# Patient Record
Sex: Female | Born: 1960 | Race: Black or African American | Hispanic: No | Smoking: Former smoker
Health system: Southern US, Community
[De-identification: ages and names within clinical notes are randomized; demographics above are authoritative.]

## PROBLEM LIST (undated history)

## (undated) DIAGNOSIS — E119 Type 2 diabetes mellitus without complications: Secondary | ICD-10-CM

## (undated) DIAGNOSIS — F32A Depression, unspecified: Secondary | ICD-10-CM

## (undated) DIAGNOSIS — T7840XA Allergy, unspecified, initial encounter: Secondary | ICD-10-CM

## (undated) DIAGNOSIS — F329 Major depressive disorder, single episode, unspecified: Secondary | ICD-10-CM

## (undated) DIAGNOSIS — K219 Gastro-esophageal reflux disease without esophagitis: Secondary | ICD-10-CM

## (undated) DIAGNOSIS — J45909 Unspecified asthma, uncomplicated: Secondary | ICD-10-CM

## (undated) HISTORY — DX: Depression, unspecified: F32.A

## (undated) HISTORY — PX: BREAST SURGERY: SHX581

## (undated) HISTORY — DX: Type 2 diabetes mellitus without complications: E11.9

## (undated) HISTORY — PX: LAPAROSCOPIC GASTRIC SLEEVE RESECTION: SHX5895

## (undated) HISTORY — DX: Allergy, unspecified, initial encounter: T78.40XA

## (undated) HISTORY — DX: Gastro-esophageal reflux disease without esophagitis: K21.9

## (undated) HISTORY — DX: Major depressive disorder, single episode, unspecified: F32.9

## (undated) HISTORY — DX: Unspecified asthma, uncomplicated: J45.909

---

## 2013-10-31 ENCOUNTER — Ambulatory Visit (INDEPENDENT_AMBULATORY_CARE_PROVIDER_SITE_OTHER): Payer: BC Managed Care – PPO | Admitting: Family Medicine

## 2013-10-31 ENCOUNTER — Ambulatory Visit (INDEPENDENT_AMBULATORY_CARE_PROVIDER_SITE_OTHER): Payer: BC Managed Care – PPO

## 2013-10-31 VITALS — BP 120/80 | HR 81 | Temp 98.3°F | Resp 20 | Ht 67.5 in | Wt 177.5 lb

## 2013-10-31 DIAGNOSIS — T07XXXA Unspecified multiple injuries, initial encounter: Secondary | ICD-10-CM

## 2013-10-31 DIAGNOSIS — S4991XA Unspecified injury of right shoulder and upper arm, initial encounter: Secondary | ICD-10-CM

## 2013-10-31 DIAGNOSIS — M79609 Pain in unspecified limb: Secondary | ICD-10-CM

## 2013-10-31 DIAGNOSIS — M79601 Pain in right arm: Secondary | ICD-10-CM

## 2013-10-31 MED ORDER — CYCLOBENZAPRINE HCL 5 MG PO TABS: ORAL_TABLET | ORAL | Status: DC

## 2013-10-31 MED ORDER — KETOROLAC TROMETHAMINE 60 MG/2ML IM SOLN
60.0000 mg | Freq: Once | INTRAMUSCULAR | Status: AC
Start: 2013-10-31 — End: 2013-10-31
  Administered 2013-10-31: 60 mg via INTRAMUSCULAR

## 2013-10-31 MED ORDER — IBUPROFEN 800 MG PO TABS: 800.0000 mg | ORAL_TABLET | Freq: Three times a day (TID) | ORAL | Status: DC | PRN

## 2013-10-31 NOTE — Progress Notes (Signed)
   Subjective:    Patient ID: Crystal Medina, female    DOB: 20-May-1960, 53 y.o.   MRN: [id number]  HPI Patient presents today with right arm pain. She was walking on a treadmill about 6 days ago when she fell backward and landed on her right arm. She has been having pain all through her arm and up into her neck. She applied ice to her arm and has been limiting movement and the pain has gotten worse today. She took motrin 800 this morning with some relief. She reports that she is very sensitive to medication and can not tolerate narcotic pain relievers.  She is not currently taking any medication. She has a history of DM2 in the past, but has lost weight and was able to come off medications.   Past Medical History  Diagnosis Date  . Allergy   . Asthma   . Diabetes mellitus without complication    Past Surgical History  Procedure Laterality Date  . Breast surgery     Family History  Problem Relation Age of Onset  . Diabetes Mother   . Hyperlipidemia Mother   . Heart disease Father   . Hyperlipidemia Father   . Hypertension Father   . Cancer Sister   . Hyperlipidemia Sister   . Hypertension Sister   . Mental illness Maternal Grandmother    History  Substance Use Topics  . Smoking status: Never Smoker   . Smokeless tobacco: Never Used  . Alcohol Use: 1.0 oz/week    2 drink(s) per week   Review of Systems No swelling, no bruising    Objective:   Physical Exam  Vitals reviewed. Constitutional: She is oriented to person, place, and time. She appears well-developed and well-nourished.  HENT:  Head: Normocephalic and atraumatic.  Eyes: Conjunctivae are normal.  Neck: Normal range of motion. Neck supple.  Cardiovascular: Normal rate.   Pulmonary/Chest: Effort normal.  Musculoskeletal: Normal range of motion. She exhibits tenderness. She exhibits no edema.  Full ROM neck, shoulders, elbows, wrists. No erythema, no ecchymosis, Tender over posterior, mid 1/3 of humerus.     Neurological: She is alert and oriented to person, place, and time.  Skin: Skin is warm and dry.  Psychiatric: She has a normal mood and affect. Her behavior is normal. Judgment and thought content normal.   Right humerus XRAY- [facility] reading (PRIMARY) by  Dr. Alwyn RenHopper- no fracture.   Assessment & Plan:  1. Arm injury, right, initial encounter - DG Humerus Right; Future - ibuprofen (ADVIL,MOTRIN) 800 MG tablet; Take 1 tablet (800 mg total) by mouth every 8 (eight) hours as needed.  Dispense: 30 tablet; Refill: 0 - cyclobenzaprine (FLEXERIL) 5 MG tablet; Take 1/2 to 1 tablet every 8 hours as needed for muscle spasm  Dispense: 10 tablet; Refill: 0  2. Right arm pain - ketorolac (TORADOL) injection 60 mg; Inject 2 mLs (60 mg total) into the muscle once. -encouraged patient to use heat 3-4 times a day and perform gentle ROM at least 3 times a day -RTC if worsening or if no improvement.  Emi Belfasteborah B. Raequan Vanschaick, FNP-BC  [facility], [facility]  11/01/2013 2:44 PM

## 2013-10-31 NOTE — Patient Instructions (Signed)
Do gentle ROM exercises 3-4 times a day and apply heat

## 2013-12-22 ENCOUNTER — Ambulatory Visit (INDEPENDENT_AMBULATORY_CARE_PROVIDER_SITE_OTHER): Payer: BC Managed Care – PPO | Admitting: Emergency Medicine

## 2013-12-22 VITALS — BP 112/64 | HR 88 | Temp 98.6°F | Resp 16 | Ht 67.5 in | Wt 183.4 lb

## 2013-12-22 DIAGNOSIS — J209 Acute bronchitis, unspecified: Secondary | ICD-10-CM

## 2013-12-22 DIAGNOSIS — J018 Other acute sinusitis: Secondary | ICD-10-CM

## 2013-12-22 MED ORDER — PSEUDOEPHEDRINE-GUAIFENESIN ER 60-600 MG PO TB12: 1.0000 | ORAL_TABLET | Freq: Two times a day (BID) | ORAL | Status: DC

## 2013-12-22 MED ORDER — AMOXICILLIN-POT CLAVULANATE 875-125 MG PO TABS: 1.0000 | ORAL_TABLET | Freq: Two times a day (BID) | ORAL | Status: DC

## 2013-12-22 MED ORDER — PROMETHAZINE-CODEINE 6.25-10 MG/5ML PO SYRP: 5.0000 mL | ORAL_SOLUTION | Freq: Four times a day (QID) | ORAL | Status: DC | PRN

## 2013-12-22 NOTE — Patient Instructions (Signed)

## 2013-12-22 NOTE — Progress Notes (Signed)
[facility] [address] [place] [address] [phone]  Date:  12/22/2013   Name:  Crystal Medina   DOB:  1960/05/24   MRN:  [id number]  PCP:  No PCP Per Patient    Chief Complaint: URI   History of Present Illness:  Crystal Medina is a 53 y.o. very pleasant female patient who presents with the following:  Ill all week with increasing symptoms of nasal congestion and facial pressure.  Has a nasal discharge and post nasal drip that is purulent.    Has a sore throat and hoarseness Has cough productive of purulent sputum.  No wheezing or shortness of breath.  No hemoptysis. No nausea or vomiting.  Symptoms worsening through the week No improvement with over the counter medications or other home remedies. Denies other complaint or health concern today.   There are no active problems to display for this patient.   Past Medical History  Diagnosis Date  . Allergy   . Asthma   . Diabetes mellitus without complication     Past Surgical History  Procedure Laterality Date  . Breast surgery      History  Substance Use Topics  . Smoking status: Never Smoker   . Smokeless tobacco: Never Used  . Alcohol Use: 1.0 oz/week    2 drink(s) per week    Family History  Problem Relation Age of Onset  . Diabetes Mother   . Hyperlipidemia Mother   . Heart disease Father   . Hyperlipidemia Father   . Hypertension Father   . Cancer Sister   . Hyperlipidemia Sister   . Hypertension Sister   . Mental illness Maternal Grandmother     Allergies  Allergen Reactions  . Sulfa Antibiotics Anaphylaxis    Medication list has been reviewed and updated.  Current Outpatient Prescriptions on File Prior to Visit  Medication Sig Dispense Refill  . ibuprofen (ADVIL,MOTRIN) 800 MG tablet Take 1 tablet (800 mg total) by mouth every 8 (eight) hours as needed.  30 tablet  0  . cyclobenzaprine (FLEXERIL) 5 MG tablet Take 1/2 to 1 tablet every 8 hours as needed for  muscle spasm  10 tablet  0   No current facility-administered medications on file prior to visit.    Review of Systems:  As per HPI, otherwise negative.    Physical Examination: Filed Vitals:   12/22/13 1439  BP: 112/64  Pulse: 88  Temp: 98.6 F (37 C)  Resp: 16   Filed Vitals:   12/22/13 1439  Height: 5' 7.5" (1.715 m)  Weight: 183 lb 6.4 oz (83.19 kg)   Body mass index is 28.28 kg/(m^2). Ideal Body Weight: Weight in (lb) to have BMI = 25: 161.7  GEN: WDWN, NAD, Non-toxic, A & O x 3 HEENT: Atraumatic, Normocephalic. Neck supple. No masses, No LAD. Ears and Nose: No external deformity. CV: RRR, No M/G/R. No JVD. No thrill. No extra heart sounds. PULM: CTA B, no wheezes, crackles, rhonchi. No retractions. No resp. distress. No accessory muscle use. ABD: S, NT, ND, +BS. No rebound. No HSM. EXTR: No c/c/e NEURO Normal gait.  PSYCH: Normally interactive. Conversant. Not depressed or anxious appearing.  Calm demeanor.    Assessment and Plan: Sinusitis Bronchitis augmentin mucinex Phen c cod  Signed,  Phillips Odor, MD

## 2014-06-10 ENCOUNTER — Ambulatory Visit (INDEPENDENT_AMBULATORY_CARE_PROVIDER_SITE_OTHER): Payer: BLUE CROSS/BLUE SHIELD

## 2014-06-10 ENCOUNTER — Ambulatory Visit (INDEPENDENT_AMBULATORY_CARE_PROVIDER_SITE_OTHER): Payer: BLUE CROSS/BLUE SHIELD | Admitting: Family Medicine

## 2014-06-10 VITALS — BP 118/60 | HR 90 | Temp 97.9°F | Ht 67.25 in | Wt 184.0 lb

## 2014-06-10 DIAGNOSIS — J301 Allergic rhinitis due to pollen: Secondary | ICD-10-CM

## 2014-06-10 DIAGNOSIS — M79644 Pain in right finger(s): Secondary | ICD-10-CM | POA: Diagnosis not present

## 2014-06-10 DIAGNOSIS — M67441 Ganglion, right hand: Secondary | ICD-10-CM | POA: Diagnosis not present

## 2014-06-10 DIAGNOSIS — R300 Dysuria: Secondary | ICD-10-CM

## 2014-06-10 LAB — POCT UA - MICROSCOPIC ONLY
Casts, Ur, LPF, POC: NEGATIVE
Crystals, Ur, HPF, POC: NEGATIVE
Yeast, UA: NEGATIVE

## 2014-06-10 LAB — POCT URINALYSIS DIPSTICK
Bilirubin, UA: NEGATIVE
Blood, UA: NEGATIVE
Glucose, UA: NEGATIVE
Ketones, UA: NEGATIVE
Nitrite, UA: NEGATIVE
Protein, UA: NEGATIVE
Spec Grav, UA: >=1.03
Urobilinogen, UA: 0.2
pH, UA: 5.5

## 2014-06-10 MED ORDER — CIPROFLOXACIN HCL 500 MG PO TABS: 500.0000 mg | ORAL_TABLET | Freq: Two times a day (BID) | ORAL | Status: DC

## 2014-06-10 MED ORDER — OLOPATADINE HCL 0.6 % NA SOLN: 2.0000 [drp] | Freq: Two times a day (BID) | NASAL | Status: DC

## 2014-06-10 NOTE — Progress Notes (Addendum)
Subjective:    Patient ID: Crystal Medina, female    DOB: 1961-02-23, 54 y.o.   MRN: [id number]  06/10/2014  Hand Pain and Cystitis This chart was scribed for [name], MD by [place] Peace, ED Scribe. The patient was seen in [facility]01. The patient's care was started at 3:52 PM.   HPI  HPI Comments: Crystal Medina is a 54 y.o. female who presents to the [facility] complaining of possible bladder infection onset past couple days. She reports experiencing slight back pain and vaginal discharge since onset. She explains that she does not drink as much water as she should which could be related to symptoms. Pt is currently separated and sexually active with a new partner for the past 1.5 years. No complaints of fever, chills, dysuria, hematuria, or increased frequency.   Pt also complains of pain to right 2nd finger with some swelling. She denies any recent injuries or traumas to affected finger. Pt is non-smoker.   No concern for foreign body in skin.  Denies numbness or tingling.  Requesting a refill of allergy nasal spray.  Review of Systems  Constitutional: Negative for fever and chills.  Genitourinary: Positive for urgency and vaginal discharge. Negative for dysuria and frequency.  Musculoskeletal: Positive for back pain, joint swelling and arthralgias.  Skin: Negative for color change and rash.  Neurological: Negative for weakness and numbness.    Past Medical History  Diagnosis Date  . Allergy   . Asthma   . Diabetes mellitus without complication    Past Surgical History  Procedure Laterality Date  . Breast surgery     Allergies  Allergen Reactions  . Sulfa Antibiotics Anaphylaxis   Current Outpatient Prescriptions  Medication Sig Dispense Refill  . fluticasone (FLONASE) 50 MCG/ACT nasal spray Place 1 spray into both nostrils daily.    Marland Kitchen loratadine-pseudoephedrine (CLARITIN-D 24-HOUR) 10-240 MG per 24 hr tablet Take 1 tablet by mouth daily.    . ciprofloxacin (CIPRO) 500 MG  tablet Take 1 tablet (500 mg total) by mouth 2 (two) times daily. 10 tablet 0  . Olopatadine HCl 0.6 % SOLN Place 2 drops into the nose 2 (two) times daily. 30.5 g 11   No current facility-administered medications for this visit.       Objective:    BP 118/60 mmHg  Pulse 90  Temp(Src) 97.9 F (36.6 C) (Oral)  Ht 5' 7.25" (1.708 m)  Wt 184 lb (83.462 kg)  BMI 28.61 kg/m2  SpO2 99% Physical Exam  Constitutional: She is oriented to person, place, and time. She appears well-developed and well-nourished. No distress.  HENT:  Head: Normocephalic and atraumatic.  Eyes: Conjunctivae and EOM are normal.  Neck: Neck supple. No tracheal deviation present.  Cardiovascular: Normal rate, regular rhythm and normal heart sounds.  Exam reveals no gallop and no friction rub.   No murmur heard. Pulmonary/Chest: Effort normal and breath sounds normal. No respiratory distress. She has no wheezes. She has no rales.  Abdominal: Soft. Bowel sounds are normal. There is tenderness in the epigastric area. There is no CVA tenderness. No hernia.  Musculoskeletal: Normal range of motion.       Hands: Right 2nd finger- +cystic like lesion at distal IP joint.  Full extension and flexion of DIP.   No swelling.  Neurological: She is alert and oriented to person, place, and time.  Skin: Skin is warm and dry. No rash noted. She is not diaphoretic. No erythema.  Psychiatric: She has a normal mood  and affect. Her behavior is normal.  Nursing note and vitals reviewed.  Results for orders placed or performed in visit on 06/10/14  POCT urinalysis dipstick  Result Value Ref Range   Color, UA yellow    Clarity, UA clear    Glucose, UA neg    Bilirubin, UA neg    Ketones, UA neg    Spec Grav, UA >=1.030    Blood, UA neg    pH, UA 5.5    Protein, UA neg    Urobilinogen, UA 0.2    Nitrite, UA neg    Leukocytes, UA small (1+)   POCT UA - Microscopic Only  Result Value Ref Range   WBC, Ur, HPF, POC 4-9    RBC,  urine, microscopic 0-2    Bacteria, U Microscopic trace    Mucus, UA trace    Epithelial cells, urine per micros 1-4    Crystals, Ur, HPF, POC neg    Casts, Ur, LPF, POC neg    Yeast, UA neg    [facility] reading (PRIMARY) by  Dr. Katrinka BlazingSmith.  FINGER SECOND RIGHT FILMS: NAD.      Assessment & Plan:   1. Dysuria   2. Finger pain, right   3. Ganglion cyst of flexor tendon sheath of finger of right hand   4. Allergic rhinitis due to pollen     1.  Dysuria:  New.  Send urine culture.  Treat with Cipro.  Pt to undergo gynecological exam next week with gynecology and will have vaginal discharge evaluated at that time. 2.  R second finger pain secondary to cystic lesion:  New. Recommend conservation treatment with rest, icing.  Pt declined anti-inflammatory treatment. If no improvement in 2 weeks, call for ortho referral. 3. Allergic Rhinitis: stable; refill provided.   Meds ordered this encounter  Medications  . loratadine-pseudoephedrine (CLARITIN-D 24-HOUR) 10-240 MG per 24 hr tablet    Sig: Take 1 tablet by mouth daily.  . fluticasone (FLONASE) 50 MCG/ACT nasal spray    Sig: Place 1 spray into both nostrils daily.  . ciprofloxacin (CIPRO) 500 MG tablet    Sig: Take 1 tablet (500 mg total) by mouth 2 (two) times daily.    Dispense:  10 tablet    Refill:  0  . Olopatadine HCl 0.6 % SOLN    Sig: Place 2 drops into the nose 2 (two) times daily.    Dispense:  30.5 g    Refill:  11    No Follow-up on file.    I personally performed the services described in this documentation, which was scribed in my presence. The recorded information has been reviewed and considered.   Laylah Riga Paulita FujitaMartin Korie Brabson, M.D. [facility]  [place] [address] [place], [place]  [address] [phone] phone [phone] fax

## 2014-06-10 NOTE — Patient Instructions (Signed)
1.  CALL IN TWO WEEKS IF FINGER STILL HURTING. 2.  ICE FINGER DAILY FOR TWO WEEKS.  Urinary Tract Infection Urinary tract infections (UTIs) can develop anywhere along your urinary tract. Your urinary tract is your body's drainage system for removing wastes and extra water. Your urinary tract includes two kidneys, two ureters, a bladder, and a urethra. Your kidneys are a pair of bean-shaped organs. Each kidney is about the size of your fist. They are located below your ribs, one on each side of your spine. CAUSES Infections are caused by microbes, which are microscopic organisms, including fungi, viruses, and bacteria. These organisms are so small that they can only be seen through a microscope. Bacteria are the microbes that most commonly cause UTIs. SYMPTOMS  Symptoms of UTIs may vary by age and gender of the patient and by the location of the infection. Symptoms in young women typically include a frequent and intense urge to urinate and a painful, burning feeling in the bladder or urethra during urination. Older women and men are more likely to be tired, shaky, and weak and have muscle aches and abdominal pain. A fever may mean the infection is in your kidneys. Other symptoms of a kidney infection include pain in your back or sides below the ribs, nausea, and vomiting. DIAGNOSIS To diagnose a UTI, your caregiver will ask you about your symptoms. Your caregiver also will ask to provide a urine sample. The urine sample will be tested for bacteria and white blood cells. White blood cells are made by your body to help fight infection. TREATMENT  Typically, UTIs can be treated with medication. Because most UTIs are caused by a bacterial infection, they usually can be treated with the use of antibiotics. The choice of antibiotic and length of treatment depend on your symptoms and the type of bacteria causing your infection. HOME CARE INSTRUCTIONS  If you were prescribed antibiotics, take them exactly as  your caregiver instructs you. Finish the medication even if you feel better after you have only taken some of the medication.  Drink enough water and fluids to keep your urine clear or pale yellow.  Avoid caffeine, tea, and carbonated beverages. They tend to irritate your bladder.  Empty your bladder often. Avoid holding urine for long periods of time.  Empty your bladder before and after sexual intercourse.  After a bowel movement, women should cleanse from front to back. Use each tissue only once. SEEK MEDICAL CARE IF:   You have back pain.  You develop a fever.  Your symptoms do not begin to resolve within 3 days. SEEK IMMEDIATE MEDICAL CARE IF:   You have severe back pain or lower abdominal pain.  You develop chills.  You have nausea or vomiting.  You have continued burning or discomfort with urination. MAKE SURE YOU:   Understand these instructions.  Will watch your condition.  Will get help right away if you are not doing well or get worse. Document Released: 12/25/2004 Document Revised: 09/16/2011 Document Reviewed: 04/25/2011 [facility] Patient Information 2015 [employer] [place]. This information is not intended to replace advice given to you by your health care provider. Make sure you discuss any questions you have with your health care provider.

## 2014-06-13 ENCOUNTER — Telehealth: Payer: Self-pay

## 2014-06-13 LAB — URINE CULTURE: Colony Count: 30000

## 2014-06-13 NOTE — Telephone Encounter (Signed)
Pt called back regarding a lab results call. Please advise at [phone]

## 2014-06-14 NOTE — Telephone Encounter (Signed)
Gave pt lab results. 

## 2014-11-16 ENCOUNTER — Encounter: Payer: Self-pay | Admitting: Physician Assistant

## 2015-03-21 ENCOUNTER — Ambulatory Visit (INDEPENDENT_AMBULATORY_CARE_PROVIDER_SITE_OTHER): Payer: BLUE CROSS/BLUE SHIELD

## 2015-03-21 ENCOUNTER — Ambulatory Visit (INDEPENDENT_AMBULATORY_CARE_PROVIDER_SITE_OTHER): Payer: BLUE CROSS/BLUE SHIELD | Admitting: Family Medicine

## 2015-03-21 VITALS — BP 106/66 | HR 85 | Temp 98.7°F | Resp 18 | Ht 67.0 in | Wt 178.8 lb

## 2015-03-21 DIAGNOSIS — Z23 Encounter for immunization: Secondary | ICD-10-CM | POA: Diagnosis not present

## 2015-03-21 DIAGNOSIS — M21611 Bunion of right foot: Secondary | ICD-10-CM

## 2015-03-21 DIAGNOSIS — M79671 Pain in right foot: Secondary | ICD-10-CM | POA: Diagnosis not present

## 2015-03-21 MED ORDER — DICLOFENAC SODIUM 1 % TD GEL: 2.0000 g | Freq: Four times a day (QID) | TRANSDERMAL | Status: DC

## 2015-03-21 NOTE — Progress Notes (Signed)
Subjective:    Patient ID: Crystal Medina, female    DOB: 1961-03-11, 54 y.o.   MRN: [id number]  03/21/2015  Foot Problem and Flu Vaccine   HPI This 54 y.o. female presents for evaluation of R longitudinal arch foot pain. No injury.  No overuse injury.  Wears high heels a lot. Has been wearing flats for the past two days and inserted orthotics into shoes.  History of gout; this feels very different than previous gouty attack.  Starts at MTP joint and then extends into foot.  Pain at rest.  August Saucer after [facility].  Ice with relief.  No medications.    PCP: Marlyne Beards Review of Systems  Constitutional: Negative for fever, chills, diaphoresis and fatigue.  Musculoskeletal: Positive for myalgias, joint swelling, arthralgias and gait problem.  Skin: Negative for color change, pallor, rash and wound.  Neurological: Negative for weakness and numbness.    Past Medical History  Diagnosis Date  . Allergy   . Asthma   . Diabetes mellitus without complication [facility])    Past Surgical History  Procedure Laterality Date  . Breast surgery     Allergies  Allergen Reactions  . Sulfa Antibiotics Anaphylaxis    Social History   Social History  . Marital Status: Legally Separated    Spouse Name: N/A  . Number of Children: N/A  . Years of Education: N/A   Occupational History  . Not on file.   Social History Main Topics  . Smoking status: Never Smoker   . Smokeless tobacco: Never Used  . Alcohol Use: 1.2 oz/week    2 Standard drinks or equivalent per week  . Drug Use: No  . Sexual Activity: Not on file   Other Topics Concern  . Not on file   Social History Narrative   Family History  Problem Relation Age of Onset  . Diabetes Mother   . Hyperlipidemia Mother   . Heart disease Father   . Hyperlipidemia Father   . Hypertension Father   . Cancer Sister   . Hyperlipidemia Sister   . Hypertension Sister   . Mental illness Maternal Grandmother        Objective:    BP 106/66  mmHg  Pulse 85  Temp(Src) 98.7 F (37.1 C) (Oral)  Resp 18  Ht  (1.702 m)  Wt 178 lb 12.8 oz (81.103 kg)  BMI 28.00 kg/m2  SpO2 97% Physical Exam  Constitutional: She is oriented to person, place, and time. She appears well-developed and well-nourished. No distress.  HENT:  Head: Normocephalic and atraumatic.  Eyes: Conjunctivae are normal. Pupils are equal, round, and reactive to light.  Neck: Normal range of motion. Neck supple.  Cardiovascular: Normal rate, regular rhythm and normal heart sounds.  Exam reveals no gallop and no friction rub.   No murmur heard. Pulmonary/Chest: Effort normal and breath sounds normal. She has no wheezes. She has no rales.  Musculoskeletal:       Right ankle: Normal. She exhibits normal range of motion, no swelling, no ecchymosis and no deformity.       Right foot: There is tenderness, bony tenderness and swelling. There is normal range of motion, normal capillary refill and no crepitus.       Feet:  Small bunion formation at MTP first.  +TTP.    Neurological: She is alert and oriented to person, place, and time.  Skin: She is not diaphoretic.  Psychiatric: She has a normal mood and affect. Her behavior is  normal.  Nursing note and vitals reviewed.   [facility] reading (PRIMARY) by  Dr. Katrinka BlazingSmith.  R FOOT: NAD      Assessment & Plan:   1. Right foot pain   2. Need for prophylactic vaccination and inoculation against influenza   3. Bunion, right foot    -New. -Rx for Voltaren gel 1% provided due to history of gastric bypass. -ice foot bid for two weeks. -supportive shoe that does not cause pain; avoid high heels. -refer to podiatry.   Orders Placed This Encounter  Procedures  . DG Foot Complete Right    Standing Status: Future     Number of Occurrences: 1     Standing Expiration Date: 03/20/2016    Order Specific Question:  Reason for Exam (SYMPTOM  OR DIAGNOSIS REQUIRED)    Answer:  R foot pain at first MTP joint and extends into  longitudinal arch    Order Specific Question:  Is the patient pregnant?    Answer:  No    Order Specific Question:  Preferred imaging location?    Answer:  External  . Flu Vaccine QUAD 36+ mos IM  . Ambulatory referral to Podiatry    Referral Priority:  Routine    Referral Type:  Consultation    Referral Reason:  Specialty Services Required    Requested Specialty:  Podiatry    Number of Visits Requested:  1   Meds ordered this encounter  Medications  . diclofenac sodium (VOLTAREN) 1 % GEL    Sig: Apply 2 g topically 4 (four) times daily.    Dispense:  100 g    Refill:  2    No Follow-up on file.    Jowana Thumma Paulita FujitaMartin Mardelle Pandolfi, M.D. [facility]  [place] [address] [place], [place]  [address] [phone] phone [phone] fax

## 2015-03-21 NOTE — Patient Instructions (Signed)
1.  Wear flats as much as possible.   2.  Recommend a supportive tennis shoe as much as possible.   3. Continue to ice foot twice daily for the next two weeks. 4.  Apply Voltaren gel to foot four times daily.

## 2015-04-16 ENCOUNTER — Ambulatory Visit (INDEPENDENT_AMBULATORY_CARE_PROVIDER_SITE_OTHER): Payer: BLUE CROSS/BLUE SHIELD

## 2015-04-16 ENCOUNTER — Ambulatory Visit (INDEPENDENT_AMBULATORY_CARE_PROVIDER_SITE_OTHER): Payer: BLUE CROSS/BLUE SHIELD | Admitting: Podiatry

## 2015-04-16 VITALS — BP 115/73 | HR 90 | Resp 16 | Ht 67.5 in | Wt 177.0 lb

## 2015-04-16 DIAGNOSIS — M779 Enthesopathy, unspecified: Secondary | ICD-10-CM | POA: Diagnosis not present

## 2015-04-16 DIAGNOSIS — M7740 Metatarsalgia, unspecified foot: Secondary | ICD-10-CM

## 2015-04-16 DIAGNOSIS — L600 Ingrowing nail: Secondary | ICD-10-CM | POA: Diagnosis not present

## 2015-04-16 MED ORDER — TRIAMCINOLONE ACETONIDE 10 MG/ML IJ SUSP
10.0000 mg | Freq: Once | INTRAMUSCULAR | Status: AC
  Administered 2015-04-16: 10 mg

## 2015-04-16 MED ORDER — DICLOFENAC SODIUM 75 MG PO TBEC: 75.0000 mg | DELAYED_RELEASE_TABLET | Freq: Two times a day (BID) | ORAL | Status: DC

## 2015-04-16 NOTE — Progress Notes (Signed)
   Subjective:    Patient ID: Crystal Medina, female    DOB: 03-22-1961, 55 y.o.   MRN: [id number]  HPI Patient presents with bilateral nail problem; great toes-medial.  Pt also presents with foot pain in their right foot; dorsal; plantar forefoot & arch; x3 months.  Review of Systems  HENT: Positive for sinus pressure.   All other systems reviewed and are negative.      Objective:   Physical Exam        Assessment & Plan:

## 2015-04-16 NOTE — Patient Instructions (Signed)

## 2015-04-18 NOTE — Progress Notes (Signed)
Subjective:     Patient ID: Crystal Medina, female   DOB: 1960-06-09, 55 y.o.   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 States this is been going on for a fairly long time   Review of Systems  All other systems reviewed and are negative.      Objective:   Physical Exam  Constitutional: She is oriented to person, place, and time.  Cardiovascular: Intact distal pulses.   Musculoskeletal: Normal range of motion.  Neurological: She is oriented to person, place, and time.  Skin: Skin is warm.  Nursing note and vitals reviewed.  neurovascular status intact muscle strength adequate range of motion within normal limits with patient noted to have discomfort in the sinus tarsi right plantar arch right and also in the medial border of the hallux bilateral that are painful when pressed with thick and tight skin which patient has a tendency towards due to her genetic skin makeup. Patient's noted to have good digital perfusion and is well oriented 3     Assessment:     Inflammatory capsulitis right with fasciitis-like symptoms and chronic ingrown toenails big toes bilateral medial border    Plan:     H&P and both conditions were discussed. Today I focused on the inflamed fascia with injection of 3 mg Kenalog 5 mg Xylocaine and advised on physical therapy supportive shoes oral anti-inflammatory we'll have her back in 2-1/2 weeks and discussed correction of her chronic ingrown toenails

## 2015-05-18 ENCOUNTER — Encounter: Payer: BLUE CROSS/BLUE SHIELD | Admitting: Podiatry

## 2015-05-25 ENCOUNTER — Encounter: Payer: BLUE CROSS/BLUE SHIELD | Admitting: Podiatry

## 2015-07-25 ENCOUNTER — Ambulatory Visit (INDEPENDENT_AMBULATORY_CARE_PROVIDER_SITE_OTHER): Payer: BLUE CROSS/BLUE SHIELD | Admitting: Physician Assistant

## 2015-07-25 VITALS — BP 138/60 | HR 87 | Temp 98.2°F | Resp 14 | Ht 67.5 in | Wt 185.0 lb

## 2015-07-25 DIAGNOSIS — H6983 Other specified disorders of Eustachian tube, bilateral: Secondary | ICD-10-CM

## 2015-07-25 MED ORDER — GUAIFENESIN ER 1200 MG PO TB12: 1.0000 | ORAL_TABLET | Freq: Two times a day (BID) | ORAL | Status: DC | PRN

## 2015-07-25 MED ORDER — FLUTICASONE PROPIONATE 50 MCG/ACT NA SUSP: 2.0000 | Freq: Every day | NASAL | Status: DC

## 2015-07-25 NOTE — Patient Instructions (Addendum)
     IF you received an x-ray today, you will receive an invoice from [facility]. Please contact [facility] at [phone] with questions or concerns regarding your invoice.   IF you received labwork today, you will receive an invoice from [facility]. Please contact [facility] at [phone] with questions or concerns regarding your invoice.   Our billing staff will not be able to assist you with questions regarding bills from these companies.  You will be contacted with the lab results as soon as they are available. The fastest way to get your results is to activate your My Chart account. Instructions are located on the last page of this paperwork. If you have not heard from us regarding the results in 2 weeks, please contact this office.    Please continue the claritin D.  If the ear pain clears up you should be using the regular Claritin (loratidine 10mg ) while the season and pollen is in full swing.  Please make sure that you are hydrating well. Use the flonase for congestion and to help open the ears.

## 2015-07-25 NOTE — Progress Notes (Signed)
[facility] [address], [place] [address] [phone]  Date:  07/25/2015   Name:  Crystal Medina   DOB:  1960-06-25   MRN:  [id number]  PCP:  No PCP Per Patient    History of Present Illness: Chief Complaint  Patient presents with  . Ear Pain    BILATERAL  . Neck Pain    TENDER  . Medication Refill    FLONASE    Crystal Medina is a 55 y.o. female patient who [facility] for cc of bilateral ear pain, tender neck pain, and medication refill flonase Patient reports that she has developed bilateral ear pain for 6 days.  She has pain radiating pain from her ear to her neck.  She has been taking the claritinD.  She states that the claritin D does help.  She has no hearing loss.  She has no coughing.     There are no active problems to display for this patient.   Past Medical History  Diagnosis Date  . Allergy   . Asthma   . Diabetes mellitus without complication [facility](HCC)     Past Surgical History  Procedure Laterality Date  . Breast surgery      Social History  Substance Use Topics  . Smoking status: Never Smoker   . Smokeless tobacco: Never Used  . Alcohol Use: 1.2 oz/week    2 Standard drinks or equivalent per week    Family History  Problem Relation Age of Onset  . Diabetes Mother   . Hyperlipidemia Mother   . Heart disease Father   . Hyperlipidemia Father   . Hypertension Father   . Cancer Sister   . Hyperlipidemia Sister   . Hypertension Sister   . Mental illness Maternal Grandmother     Allergies  Allergen Reactions  . Sulfa Antibiotics Anaphylaxis    Medication list has been reviewed and updated.  Current Outpatient Prescriptions on File Prior to Visit  Medication Sig Dispense Refill  . diclofenac (VOLTAREN) 75 MG EC tablet Take 1 tablet (75 mg total) by mouth 2 (two) times daily. (Patient not taking: Reported on 07/25/2015) 50 tablet 2  . loratadine-pseudoephedrine (CLARITIN-D 24-HOUR) 10-240 MG per 24 hr tablet Take  1 tablet by mouth daily. Reported on 07/25/2015    . Olopatadine HCl 0.6 % SOLN Place 2 drops into the nose 2 (two) times daily. (Patient not taking: Reported on 07/25/2015) 30.5 g 11   No current facility-administered medications on file prior to visit.    ROS ROS otherwise unremarkable unless listed above.   Physical Examination: BP 138/60 mmHg  Pulse 87  Temp(Src) 98.2 F (36.8 C) (Oral)  Resp 14  Ht 5' 7.5" (1.715 m)  Wt 185 lb (83.915 kg)  BMI 28.53 kg/m2  SpO2 99% Ideal Body Weight: Weight in (lb) to have BMI = 25: 161.7  Physical Exam  Constitutional: She is oriented to person, place, and time. She appears well-developed and well-nourished. No distress.  HENT:  Head: Normocephalic and atraumatic.  Right Ear: Tympanic membrane, external ear and ear canal normal.  Left Ear: Tympanic membrane, external ear and ear canal normal.  Nose: Mucosal edema and rhinorrhea present. Right sinus exhibits no maxillary sinus tenderness and no frontal sinus tenderness. Left sinus exhibits no maxillary sinus tenderness and no frontal sinus tenderness.  Mouth/Throat: No uvula swelling. No oropharyngeal exudate, posterior oropharyngeal edema or posterior oropharyngeal erythema.  Eyes: Conjunctivae and EOM are normal. Pupils are equal, round,  and reactive to light.  Cardiovascular: Normal rate and regular rhythm.  Exam reveals no gallop, no distant heart sounds and no friction rub.   No murmur Medina. Pulmonary/Chest: Effort normal and breath sounds normal. No respiratory distress. She has no decreased breath sounds. She has no wheezes. She has no rhonchi.  Lymphadenopathy:       Head (right side): No submandibular, no tonsillar, no preauricular and no posterior auricular adenopathy present.       Head (left side): No submandibular, no tonsillar, no preauricular and no posterior auricular adenopathy present.  Neurological: She is alert and oriented to person, place, and time.  Skin: She is not  diaphoretic.  Psychiatric: She has a normal mood and affect. Her behavior is normal.     Assessment and Plan: Crystal Medina is a 55 y.o. female who is here today for ear pain. This does not appear to be infection, but eustachian tube dysfunction secondary to allergies. We will add the flonase at this time. i have also advised heavy hydration, and the addition of mucinex.    1. Eustachian tube dysfunction, bilateral - Guaifenesin (MUCINEX MAXIMUM STRENGTH) 1200 MG TB12; Take 1 tablet (1,200 mg total) by mouth every 12 (twelve) hours as needed.  Dispense: 14 tablet; Refill: 1 - fluticasone (FLONASE) 50 MCG/ACT nasal spray; Place 2 sprays into both nostrils daily.  Dispense: 16 g; Refill: 2   Trena Platt, PA-C [facility] 07/25/2015 6:56 PM

## 2015-08-01 ENCOUNTER — Telehealth: Payer: Self-pay

## 2015-08-01 DIAGNOSIS — J309 Allergic rhinitis, unspecified: Secondary | ICD-10-CM | POA: Insufficient documentation

## 2015-08-01 NOTE — Telephone Encounter (Signed)
PA started on covermymeds for generic flonase. Form indicated that pt must have failed at least two OTC NS including OTC fluticasone. Pt will have to buy OTC. Notified pharm of this.

## 2016-02-04 ENCOUNTER — Ambulatory Visit (INDEPENDENT_AMBULATORY_CARE_PROVIDER_SITE_OTHER): Payer: BLUE CROSS/BLUE SHIELD | Admitting: Family Medicine

## 2016-02-04 VITALS — BP 128/82 | HR 84 | Temp 98.6°F | Resp 17 | Ht 69.5 in | Wt 185.0 lb

## 2016-02-04 DIAGNOSIS — Z23 Encounter for immunization: Secondary | ICD-10-CM

## 2016-02-04 DIAGNOSIS — M79645 Pain in left finger(s): Secondary | ICD-10-CM | POA: Diagnosis not present

## 2016-02-04 DIAGNOSIS — K219 Gastro-esophageal reflux disease without esophagitis: Secondary | ICD-10-CM

## 2016-02-04 MED ORDER — PREDNISONE 20 MG PO TABS: 20.0000 mg | ORAL_TABLET | Freq: Every day | ORAL | 0 refills | Status: DC

## 2016-02-04 MED ORDER — RANITIDINE HCL 150 MG PO TABS
150.0000 mg | ORAL_TABLET | Freq: Every day | ORAL | 0 refills | Status: DC
Start: 2016-02-04 — End: 2016-03-12

## 2016-02-04 MED ORDER — OLOPATADINE HCL 0.6 % NA SOLN: 2.0000 [drp] | Freq: Two times a day (BID) | NASAL | 11 refills | Status: DC

## 2016-02-04 NOTE — Progress Notes (Signed)
Patient ID: Crystal Medina, female    DOB: [birth year], 55 y.o.   MRN: [id number]  PCP: No PCP Per Patient  Chief Complaint  Patient presents with  . Hand Pain    left side   . Medication Refill    olopatadine     Subjective:   HPI 55 year old female presents for evaluation of left middle finger pain. [facility].  Today she reports noticing a bump like mass on the left, anterior third digit distal region. The mass is now tender to touch and swollen. She reports noticing increased redness. Denies any known injury or insect bite.  Social History   Social History  . Marital status: Legally Separated    Spouse name: N/A  . Number of children: N/A  . Years of education: N/A   Occupational History  . Not on file.   Social History Main Topics  . Smoking status: Current Every Day Smoker    Packs/day: 0.50    Years: 30.00  . Smokeless tobacco: Never Used  . Alcohol use 1.2 oz/week    2 Standard drinks or equivalent per week  . Drug use: No  . Sexual activity: No   Other Topics Concern  . Not on file   Social History Narrative  . No narrative on file   Family History  Problem Relation Age of Onset  . Diabetes Mother   . Hyperlipidemia Mother   . Heart disease Father   . Hyperlipidemia Father   . Hypertension Father   . Cancer Sister   . Hyperlipidemia Sister   . Hypertension Sister   . Mental illness Maternal Grandmother     Review of Systems See HPI    Patient Active Problem List   Diagnosis Date Noted  . AR (allergic rhinitis) 08/01/2015     Prior to Admission medications   Medication Sig Start Date End Date Taking? Authorizing Provider  loratadine-pseudoephedrine (CLARITIN-D 24-HOUR) 10-240 MG per 24 hr tablet Take 1 tablet by mouth daily. Reported on 07/25/2015   Yes Historical Provider, MD  Olopatadine HCl 0.6 % SOLN Place 2 drops into the nose 2 (two) times daily. 06/10/14  Yes Crystal ChickKristi M Smith, MD     Allergies    Allergen Reactions  . Sulfa Antibiotics Anaphylaxis       Objective:  Physical Exam  Constitutional: She appears well-developed and well-nourished.  Cardiovascular: Normal rate, regular rhythm, normal heart sounds and intact distal pulses.   Pulmonary/Chest: Effort normal and breath sounds normal.  Musculoskeletal:       Right hand: She exhibits tenderness and swelling.       Hands: Skin: Skin is warm and dry.      Vitals:   02/04/16 1122  BP: 128/82  Pulse: 84  Resp: 17  Temp: 98.6 F (37 C)   Assessment & Plan:  1. Finger pain, left, suspect inflammatory process of unknown etiology. No evidence of insect bite, or injury. Mass is non-fluctuant, tender to touch.   2. Gastroesophageal reflux disease without esophagitis 3. Encounter for immunization - Flu Vaccine QUAD 36+ mos IM  Plan: Treat with anti-inflammatory. If no improvement, will consider imaging of finger to determine if fracture is present. -Take Prednisone 20 mg,  in mornings with breakfast as follows:  Take 3 pills for 3 days, Take 2 pills for 3 days, and Take 1 pill for 3 days.  Complete all medication. -Take Ranitidine 150 mg once daily to prevent flare  up of acid reflux while taking prednisone. -Refilled chronic allergy medication Olopatadine HCl 0.6 %, 2 drops per nares, twice daily.  Return for follow-up in 5 days for re-evaluation.  Godfrey PickKimberly S. Tiburcio PeaHarris, [name], [name] [facility]

## 2016-02-04 NOTE — Patient Instructions (Addendum)
Prednisone x 5 days, 1 tablet daily.   Return in five days for re-evaluation. I am here Friday and Monday.  Continue to alternate heat and ice on finger to reduce swelling.  To prevent abdominal upset, take ranitidine 150 mg daily while taking Prednisone.  IF you received an x-ray today, you will receive an invoice from [facility]. Please contact [facility] at [phone] with questions or concerns regarding your invoice.   IF you received labwork today, you will receive an invoice from [facility]. Please contact [facility] at [phone] with questions or concerns regarding your invoice.   Our billing staff will not be able to assist you with questions regarding bills from these companies.  You will be contacted with the lab results as soon as they are available. The fastest way to get your results is to activate your My Chart account. Instructions are located on the last page of this paperwork. If you have not heard from us regarding the results in 2 weeks, please contact this office.    We recommend that you schedule a mammogram for breast cancer screening. Typically, you do not need a referral to do this. Please contact a local imaging center to schedule your mammogram.  [facility] - [phone]  *ask for the Radiology Department [facility] - [phone] or [phone]  [facility] - [phone] [facility] - [phone] [facility] - [phone]) [phone]  *ask for the Radiology Department [facility] - [phone]  *ask for the Radiology Department [facility] - [phone]  *ask for the Mammography Department [facility] - [phone]

## 2016-02-08 ENCOUNTER — Ambulatory Visit (INDEPENDENT_AMBULATORY_CARE_PROVIDER_SITE_OTHER): Payer: BLUE CROSS/BLUE SHIELD

## 2016-02-08 ENCOUNTER — Ambulatory Visit (INDEPENDENT_AMBULATORY_CARE_PROVIDER_SITE_OTHER): Payer: BLUE CROSS/BLUE SHIELD | Admitting: Family Medicine

## 2016-02-08 VITALS — BP 130/72 | HR 102 | Temp 98.7°F | Resp 18 | Ht 69.5 in | Wt 188.0 lb

## 2016-02-08 DIAGNOSIS — Z0000 Encounter for general adult medical examination without abnormal findings: Secondary | ICD-10-CM | POA: Diagnosis not present

## 2016-02-08 DIAGNOSIS — Z1329 Encounter for screening for other suspected endocrine disorder: Secondary | ICD-10-CM

## 2016-02-08 DIAGNOSIS — M79645 Pain in left finger(s): Secondary | ICD-10-CM | POA: Diagnosis not present

## 2016-02-08 DIAGNOSIS — F17200 Nicotine dependence, unspecified, uncomplicated: Secondary | ICD-10-CM | POA: Diagnosis not present

## 2016-02-08 DIAGNOSIS — Z131 Encounter for screening for diabetes mellitus: Secondary | ICD-10-CM | POA: Diagnosis not present

## 2016-02-08 DIAGNOSIS — Z13220 Encounter for screening for lipoid disorders: Secondary | ICD-10-CM

## 2016-02-08 MED ORDER — ALBUTEROL SULFATE HFA 108 (90 BASE) MCG/ACT IN AERS: 2.0000 | INHALATION_SPRAY | RESPIRATORY_TRACT | 1 refills | Status: DC | PRN

## 2016-02-08 MED ORDER — BUPROPION HCL ER (SR) 150 MG PO TB12: 150.0000 mg | ORAL_TABLET | Freq: Two times a day (BID) | ORAL | 1 refills | Status: DC

## 2016-02-08 NOTE — Progress Notes (Signed)
Patient ID: Crystal MooreJoeann Medina, female    DOB: 26-May-1960, 55 y.o.   MRN: [id number]  PCP: No PCP Per Patient  Chief Complaint  Patient presents with  . Annual Exam    Subjective:   HPI 55 year old female, presents for a complete physical exam. [facility]. Last mammogram in February. 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  Other history includes a prior diagnosis of Type 2 Diabetes which remitted after gastric bypass surgery in 2013. She has stopped smoking 4 years ago and recently picked the habit back up after her mother passed away this year of lung cancer. She also has had intermittent problems with acid reflux.  Family history  Mother had high blood pressure. Father had heart disease and died of his  third massive heart attack at 7150.    Review of Systems  Eyes:       Need exam  Respiratory: Positive for cough and wheezing.        Cough -uses albuterol   Cardiovascular: Negative for chest pain, palpitations and leg swelling.  Genitourinary: Negative for vaginal discharge.  Neurological: Negative for headaches.  Psychiatric/Behavioral: The patient is nervous/anxious.    Refer to HPI for other pertinent history.  Patient Active Problem List   Diagnosis Date Noted  . AR (allergic rhinitis) 08/01/2015     Prior to Admission medications   Medication Sig Start Date End Date Taking? Authorizing Provider  loratadine-pseudoephedrine (CLARITIN-D 24-HOUR) 10-240 MG per 24 hr tablet Take 1 tablet by mouth daily. Reported on 07/25/2015   Yes Historical Provider, MD  predniSONE (DELTASONE) 20 MG tablet Take 1 tablet (20 mg total) by mouth daily with breakfast. 02/04/16  Yes Doyle AskewKimberly Stephenia Hailynn Slovacek, FNP  ranitidine (ZANTAC) 150 MG tablet Take 1 tablet (150 mg total) by mouth daily. 02/04/16  Yes Doyle AskewKimberly Stephenia Senaya Dicenso, FNP     Allergies  Allergen Reactions  . Sulfa Antibiotics Anaphylaxis       Objective:  Physical Exam  Constitutional: She is oriented to person, place, and time. She appears well-developed and well-nourished.  HENT:  Head: Normocephalic and atraumatic.  Right Ear: External ear normal.  Left Ear: External ear normal.  Nose: Nose normal.  Mouth/Throat: Oropharynx is clear and moist.  Eyes: Conjunctivae and EOM are normal. Pupils are equal, round, and reactive to light.  Neck: Normal range of motion. Neck supple.  Cardiovascular: Normal rate, regular rhythm, normal heart sounds and intact distal pulses.   Pulmonary/Chest: Effort normal and breath sounds normal.  Abdominal: Soft. Bowel sounds are normal.  Musculoskeletal: Normal range of motion.  Neurological: She is alert and oriented to person, place, and time. She has normal reflexes.  Skin: Skin is warm and dry.  Psychiatric: She has a normal mood and affect. Her behavior is normal. Judgment and thought content normal.   Vitals:   02/08/16 1650  BP: 130/72  Pulse: (!) 102  Resp: 18  Temp: 98.7 F (37.1 C)     Assessment & Plan:  1. Annual physical exam - CBC with Differential/Platelet - POCT Microscopic Urinalysis ([facility]) - POCT urinalysis dipstick Age-appropriate anticipatory guidance provided.  2. Finger pain, left, this is small mass , which remains tender to palpation, deep within the dermis of the left finger that was non-responsive to prednisone taper.  X-ray was unremarkable. Referring to dermatology for further evaluation. - DG Finger Middle Left - Ambulatory referral to Dermatology  3. Screening for diabetes mellitus - COMPLETE METABOLIC PANEL WITH GFR - Hemoglobin A1c  4. Screening for thyroid disorder - Thyroid Panel With TSH  5. Screening, lipid - Lipid panel  6. Tobacco use disorder Plan: -Start Wellbutrin 150 mg twice daily -Albuterol inhaler may use every 4-6 hours for shortness of breath or wheezing.  We will follow-up with you regarding your lab results.  Return  in 6 weeks for a follow-up appointment for Wellbutrin.  Godfrey PickKimberly S. Tiburcio PeaHarris, [name], [name] [facility]

## 2016-02-08 NOTE — Patient Instructions (Addendum)
Start Wellbutrin 150 mg twice daily.  I refilled your albuterol inhaler and you may use every 4-6 hours for shortness of breath or wheezing.  We will follow-up with you regarding your lab results.  Return in 6 weeks for a follow-up appointment.  Godfrey PickKimberly S. Tiburcio PeaHarris, [name], [name] [facility]  IF you received an x-ray today, you will receive an invoice from [facility]. Please contact [facility] at [phone] with questions or concerns regarding your invoice.   IF you received labwork today, you will receive an invoice from [facility]. Please contact [facility] at [phone] with questions or concerns regarding your invoice.   Our billing staff will not be able to assist you with questions regarding bills from these companies.  You will be contacted with the lab results as soon as they are available. The fastest way to get your results is to activate your My Chart account. Instructions are located on the last page of this paperwork. If you have not heard from us regarding the results in 2 weeks, please contact this office.    You Can Quit Smoking If you are ready to quit smoking or are thinking about it, congratulations! You have chosen to help yourself be healthier and live longer! There are lots of different ways to quit smoking. Nicotine gum, nicotine patches, a nicotine inhaler, or nicotine nasal spray can help with physical craving. Hypnosis, support groups, and medicines help break the habit of smoking. TIPS TO GET OFF AND STAY OFF CIGARETTES  Learn to predict your moods. Do not let a bad situation be your excuse to have a cigarette. Some situations in your life might tempt you to have a cigarette.  Ask friends and co-workers not to smoke around you.  Make your home smoke-free.  Never have "just one" cigarette. It leads to wanting another and another. Remind yourself of your decision to  quit.  On a card, make a list of your reasons for not smoking. Read it at least the same number of times a day as you have a cigarette. Tell yourself everyday, "I do not want to smoke. I choose not to smoke."  Ask someone at home or work to help you with your plan to quit smoking.  Have something planned after you eat or have a cup of coffee. Take a walk or get other exercise to perk you up. This will help to keep you from overeating.  Try a relaxation exercise to calm you down and decrease your stress. Remember, you may be tense and nervous the first two weeks after you quit. This will pass.  Find new activities to keep your hands busy. Play with a pen, coin, or rubber band. Doodle or draw things on paper.  Brush your teeth right after eating. This will help cut down the craving for the taste of tobacco after meals. You can try mouthwash too.  Try gum, breath mints, or diet candy to keep something in your mouth. IF YOU SMOKE AND WANT TO QUIT:  Do not stock up on cigarettes. Never buy a carton. Wait until one pack is finished before you buy another.  Never carry cigarettes with you at work or at home.  Keep cigarettes as far away from you as possible. Leave them with someone else.  Never carry matches or a lighter with you.  Ask yourself, "Do I need this cigarette or is this just a reflex?"  Bet with someone that you  can quit. Put cigarette money in a piggy bank every morning. If you smoke, you give up the money. If you do not smoke, by the end of the week, you keep the money.  Keep trying. It takes 21 days to change a habit!  Talk to your doctor about using medicines to help you quit. These include nicotine replacement gum, lozenges, or skin patches.   This information is not intended to replace advice given to you by your health care provider. Make sure you discuss any questions you have with your health care provider.   Document Released: 01/11/2009 Document Revised: 06/09/2011  Document Reviewed: 01/11/2009 Elsevier Interactive Patient Education 2016 [employer]   Exercising to [employer] Exercising regularly is important. It has many health benefits, such as:  Improving your overall fitness, flexibility, and endurance.  Increasing your bone density.  Helping with weight control.  Decreasing your body fat.  Increasing your muscle strength.  Reducing stress and tension.  Improving your overall health. In order to become healthy and stay healthy, it is recommended that you do moderate-intensity and vigorous-intensity exercise. You can tell that you are exercising at a moderate intensity if you have a higher heart rate and faster breathing, but you are still able to hold a conversation. You can tell that you are exercising at a vigorous intensity if you are breathing much harder and faster and cannot hold a conversation while exercising. HOW OFTEN SHOULD I EXERCISE? Choose an activity that you enjoy and set realistic goals. Your health care provider can help you to make an activity plan that works for you. Exercise regularly as directed by your health care provider. This may include:   Doing resistance training twice each week, such as:  Push-ups.  Sit-ups.  Lifting weights.  Using resistance bands.  Doing a given intensity of exercise for a given amount of time. Choose from these options:  150 minutes of moderate-intensity exercise every week.  75 minutes of vigorous-intensity exercise every week.  A mix of moderate-intensity and vigorous-intensity exercise every week. Children, pregnant women, people who are out of shape, people who are overweight, and older adults may need to consult a health care provider for individual recommendations. If you have any sort of medical condition, be sure to consult your health care provider before starting a new exercise program.  WHAT ARE SOME EXERCISE IDEAS? Some moderate-intensity exercise ideas include:    Walking at a rate of 1 mile in 15 minutes.  Biking.  Hiking.  Golfing.  Dancing. Some vigorous-intensity exercise ideas include:   Walking at a rate of at least 4.5 miles per hour.  Jogging or running at a rate of 5 miles per hour.  Biking at a rate of at least 10 miles per hour.  Lap swimming.  Roller-skating or in-line skating.  Cross-country skiing.  Vigorous competitive sports, such as football, basketball, and soccer.  Jumping rope.  Aerobic dancing. WHAT ARE SOME EVERYDAY ACTIVITIES THAT CAN HELP ME TO GET EXERCISE?  Yard work, such as:  Child psychotherapistushing a lawn mower.  Raking and bagging leaves.  Washing and waxing your car.  Pushing a stroller.  Shoveling snow.  Gardening.  Washing windows or floors. HOW CAN I BE MORE ACTIVE IN MY DAY-TO-DAY ACTIVITIES?  Use the stairs instead of the elevator.  Take a walk during your lunch break.  If you drive, park your car farther away from work or school.  If you take public transportation, get off one stop early and  walk the rest of the way.  Make all of your phone calls while standing up and walking around.  Get up, stretch, and walk around every 30 minutes throughout the day. WHAT GUIDELINES SHOULD I FOLLOW WHILE EXERCISING?  Do not exercise so much that you hurt yourself, feel dizzy, or get very short of breath.  Consult your health care provider before starting a new exercise program.  Wear comfortable clothes and shoes with good support.  Drink plenty of water while you exercise to prevent dehydration or heat stroke. Body water is lost during exercise and must be replaced.  Work out until you breathe faster and your heart beats faster.   This information is not intended to replace advice given to you by your health care provider. Make sure you discuss any questions you have with your health care provider.   Document Released: 04/19/2010 Document Revised: 04/07/2014 Document Reviewed:  08/18/2013 Elsevier Interactive Patient Education [employer]

## 2016-02-09 LAB — COMPLETE METABOLIC PANEL WITH GFR
ALT: 14 U/L (ref 6–29)
AST: 16 U/L (ref 10–35)
Albumin: 4.2 g/dL (ref 3.6–5.1)
Alkaline Phosphatase: 54 U/L (ref 33–130)
BUN: 16 mg/dL (ref 7–25)
CO2: 23 mmol/L (ref 20–31)
Calcium: 9.5 mg/dL (ref 8.6–10.4)
Chloride: 106 mmol/L (ref 98–110)
Creat: 0.76 mg/dL (ref 0.50–1.05)
GFR, Est African American: >89 mL/min (ref >=60)
GFR, Est Non African American: 89 mL/min (ref >=60)
Glucose, Bld: 99 mg/dL (ref 65–99)
Potassium: 4.1 mmol/L (ref 3.5–5.3)
Sodium: 143 mmol/L (ref 135–146)
Total Bilirubin: 0.5 mg/dL (ref 0.2–1.2)
Total Protein: 7.4 g/dL (ref 6.1–8.1)

## 2016-02-09 LAB — CBC WITH DIFFERENTIAL/PLATELET
Basophils Absolute: 0 cells/uL (ref 0–200)
Basophils Relative: 0 %
Eosinophils Absolute: 0 cells/uL — ABNORMAL LOW (ref 15–500)
Eosinophils Relative: 0 %
HCT: 38.9 % (ref 35.0–45.0)
Hemoglobin: 13 g/dL (ref 11.7–15.5)
Lymphocytes Relative: 19 %
Lymphs Abs: 1311 cells/uL (ref 850–3900)
MCH: 34.9 pg — ABNORMAL HIGH (ref 27.0–33.0)
MCHC: 33.4 g/dL (ref 32.0–36.0)
MCV: 104.6 fL — ABNORMAL HIGH (ref 80.0–100.0)
MPV: 11.5 fL (ref 7.5–12.5)
Monocytes Absolute: 345 cells/uL (ref 200–950)
Monocytes Relative: 5 %
Neutro Abs: 5244 cells/uL (ref 1500–7800)
Neutrophils Relative %: 76 %
Platelets: 336 10*3/uL (ref 140–400)
RBC: 3.72 MIL/uL — ABNORMAL LOW (ref 3.80–5.10)
RDW: 12.5 % (ref 11.0–15.0)
WBC: 6.9 10*3/uL (ref 3.8–10.8)

## 2016-02-09 LAB — LIPID PANEL
Cholesterol: 231 mg/dL — ABNORMAL HIGH (ref <200)
HDL: 97 mg/dL (ref >50)
LDL Cholesterol: 121 mg/dL — ABNORMAL HIGH (ref <100)
Total CHOL/HDL Ratio: 2.4 Ratio (ref <5.0)
Triglycerides: 65 mg/dL (ref <150)
VLDL: 13 mg/dL (ref <30)

## 2016-02-10 LAB — THYROID PANEL WITH TSH
Free Thyroxine Index: 1.8 (ref 1.4–3.8)
T3 Uptake: 36 % — ABNORMAL HIGH (ref 22–35)
T4, Total: 5 ug/dL (ref 4.5–12.0)
TSH: 0.66 mIU/L

## 2016-02-13 ENCOUNTER — Telehealth: Payer: Self-pay | Admitting: *Deleted

## 2016-02-13 NOTE — Telephone Encounter (Signed)
A1Cs have to be added on within 24 hours.  Would you like for lab to call patient in to do an in house A1C?

## 2016-02-13 NOTE — Telephone Encounter (Signed)
-----   Message from Doyle AskewKimberly Stephenia [name], FNP sent at 02/12/2016  7:49 PM EST ----- Please add a hemoglobin A1C to patients lab from Friday 11/10 ----- Message ----- From: Interface, Lab In Three Zero Five Sent: 02/09/2016   5:42 PM To: Doyle AskewKimberly Stephenia [name], FNP

## 2016-02-13 NOTE — Telephone Encounter (Signed)
Her glucose was normal without fasting. I was just going to check it due to patients history. Thanks for informing the time frame to add on labs.

## 2016-02-14 ENCOUNTER — Encounter: Payer: Self-pay | Admitting: Family Medicine

## 2016-02-14 NOTE — Progress Notes (Signed)
February 14, 2016   [place] [address] [place] [address]   Dear Ms. Shivley,  Below are the results from your recent visit were all as expected. Your free thyroid hormone was elevated but this is not clinically significant for hyperthyroidism as the remainder of your Thyroid panel was normal. I recommend that you repeat a thyroid panel in 6 months in order for [place] to monitor levels.    Your total cholesterol and LDL (bad cholesterol) were slightly elevated, although at this point are not at levels that I recommend medication treatment.  Continue to increase physical activity which will result in weight management, therefore reducing your cholesterol levels.  Resulted Orders  COMPLETE METABOLIC PANEL WITH GFR  Result Value Ref Range   Sodium 143 135 - 146 mmol/L   Potassium 4.1 3.5 - 5.3 mmol/L   Chloride 106 98 - 110 mmol/L   CO2 23 20 - 31 mmol/L   Glucose, Bld 99 65 - 99 mg/dL   BUN 16 7 - 25 mg/dL   Creat 0.76 0.50 - 1.05 mg/dL     Comment:       For patients > or = 55 years of age: The upper reference limit for Creatinine is approximately 13% higher for people identified as African-American.      Total Bilirubin 0.5 0.2 - 1.2 mg/dL   Alkaline Phosphatase 54 33 - 130 U/L   AST 16 10 - 35 U/L   ALT 14 6 - 29 U/L   Total Protein 7.4 6.1 - 8.1 g/dL   Albumin 4.2 3.6 - 5.1 g/dL   Calcium 9.5 8.6 - 10.4 mg/dL   GFR, Est African American >89 >=60 mL/min   GFR, Est Non African American 89 >=60 mL/min   Narrative   Performed at:  [address], [address]                [place], [place] [address]  CBC with Differential/Platelet  Result Value Ref Range   WBC 6.9 3.8 - 10.8 K/uL   RBC 3.72 (L) 3.80 - 5.10 MIL/uL   Hemoglobin 13.0 11.7 - 15.5 g/dL   HCT 38.9 35.0 - 45.0 %   MCV 104.6 (H) 80.0 - 100.0 fL   MCH 34.9 (H) 27.0 - 33.0 pg   MCHC 33.4 32.0 - 36.0 g/dL   RDW 12.5 11.0 - 15.0 %   Platelets 336 140 - 400  K/uL   MPV 11.5 7.5 - 12.5 fL   Neutro Abs 5,244 1,500 - 7,800 cells/uL   Lymphs Abs 1,311 850 - 3,900 cells/uL   Monocytes Absolute 345 200 - 950 cells/uL   Eosinophils Absolute 0 (L) 15 - 500 cells/uL   Basophils Absolute 0 0 - 200 cells/uL   Neutrophils Relative % 76 %   Lymphocytes Relative 19 %   Monocytes Relative 5 %   Eosinophils Relative 0 %   Basophils Relative 0 %   Smear Review Criteria for review not met    Narrative   Performed at:  [address], [address]                [place],  [address]  Thyroid Panel With TSH  Result Value Ref Range   T4, Total 5.0 4.5 - 12.0  ug/dL   T3 Uptake 36 (H) 22 - 35 %   Free Thyroxine Index 1.8 1.4 - 3.8   TSH 0.66 mIU/L     Comment:       Reference Range   > or = 20 Years  0.40-4.50   Pregnancy Range First trimester  0.26-2.66 Second trimester 0.55-2.73 Third trimester  0.43-2.91      Narrative   Performed at:  [address]                [place], [place] [address]  Lipid panel  Result Value Ref Range   Cholesterol 231 (H) <200 mg/dL     Comment:     ** Please note change in reference range(s). **      Triglycerides 65 <150 mg/dL     Comment:     ** Please note change in reference range(s). **      HDL 97 >50 mg/dL     Comment:     ** Please note change in reference range(s). **      Total CHOL/HDL Ratio 2.4 <5.0 Ratio   VLDL 13 <30 mg/dL   LDL Cholesterol 121 (H) <100 mg/dL     Comment:     ** Please note change in reference range(s). **      Narrative   Performed at:  [address], [address]                [place], [place] [address]     If you have any questions or concerns, please don't hesitate to call.  Sincerely,   Carroll Sage. Kenton Kingfisher, [name], [name] [facility]

## 2016-02-15 ENCOUNTER — Ambulatory Visit (INDEPENDENT_AMBULATORY_CARE_PROVIDER_SITE_OTHER): Payer: BLUE CROSS/BLUE SHIELD | Admitting: Osteopathic Medicine

## 2016-02-15 ENCOUNTER — Encounter: Payer: Self-pay | Admitting: Osteopathic Medicine

## 2016-02-15 VITALS — BP 132/68 | HR 97 | Temp 98.6°F | Resp 16 | Ht 69.0 in | Wt 184.0 lb

## 2016-02-15 DIAGNOSIS — B9789 Other viral agents as the cause of diseases classified elsewhere: Secondary | ICD-10-CM

## 2016-02-15 DIAGNOSIS — J069 Acute upper respiratory infection, unspecified: Secondary | ICD-10-CM

## 2016-02-15 DIAGNOSIS — Z87891 Personal history of nicotine dependence: Secondary | ICD-10-CM | POA: Diagnosis not present

## 2016-02-15 MED ORDER — IPRATROPIUM BROMIDE 0.03 % NA SOLN: 2.0000 | Freq: Three times a day (TID) | NASAL | 0 refills | Status: DC | PRN

## 2016-02-15 MED ORDER — GUAIFENESIN-CODEINE 100-10 MG/5ML PO SYRP: 5.0000 mL | ORAL_SOLUTION | Freq: Four times a day (QID) | ORAL | 0 refills | Status: DC | PRN

## 2016-02-15 NOTE — Progress Notes (Signed)
HPI: Crystal MooreJoeann Medina is a 55 y.o. female  who presents to [facility] today, 02/15/16,  for chief complaint of:  Chief Complaint  Patient presents with  . URI    x 3 days    Coughing and hoarseness is worst symptoms. Ongoing several days, just prior to quitting smoking. No fever/chills, no SOB, mild sore throat. Sinus congestion and ear pressure are bothersome.    Past medical, surgical, social and family history reviewed: Past Medical History:  Diagnosis Date  . Allergy   . Asthma   . Diabetes mellitus without complication [facility](HCC)    Past Surgical History:  Procedure Laterality Date  . BREAST SURGERY     Social History  Substance Use Topics  . Smoking status: Current Every Day Smoker    Packs/day: 0.50    Years: 30.00  . Smokeless tobacco: Never Used  . Alcohol use 1.2 oz/week    2 Standard drinks or equivalent per week   Family History  Problem Relation Age of Onset  . Diabetes Mother   . Hyperlipidemia Mother   . Heart disease Father   . Hyperlipidemia Father   . Hypertension Father   . Cancer Sister   . Hyperlipidemia Sister   . Hypertension Sister   . Mental illness Maternal Grandmother      Current medication list and allergy/intolerance information reviewed:   Current Outpatient Prescriptions  Medication Sig Dispense Refill  . albuterol (PROVENTIL HFA;VENTOLIN HFA) 108 (90 Base) MCG/ACT inhaler Inhale 2 puffs into the lungs every 4 (four) hours as needed for wheezing or shortness of breath (cough, shortness of breath or wheezing.). 1 Inhaler 1  . buPROPion (WELLBUTRIN SR) 150 MG 12 hr tablet Take 1 tablet (150 mg total) by mouth 2 (two) times daily. 90 tablet 1  . loratadine-pseudoephedrine (CLARITIN-D 24-HOUR) 10-240 MG per 24 hr tablet Take 1 tablet by mouth daily. Reported on 07/25/2015    . ranitidine (ZANTAC) 150 MG tablet Take 1 tablet (150 mg total) by mouth daily. 30 tablet 0   No current facility-administered medications for  this visit.    Allergies  Allergen Reactions  . Sulfa Antibiotics Anaphylaxis      Review of Systems:  Constitutional:  No  fever, no chills, +recent illness, No unintentional weight changes. No significant fatigue.   HEENT: No  headache, no vision change, no hearing change, No sore throat, +sinus pressure  Cardiac: No  chest pain, No  pressure, No palpitations, No  Orthopnea  Respiratory:  No  shortness of breath. + Coug   Musculoskeletal: No new myalgia/arthralgia  Skin: No  Rash,  Hem/Onc:No  abnormal lymph node  Neurologic: No  weakness, No  dizziness  Exam:  BP 132/68 (BP Location: Right Arm, Patient Position: Sitting, Cuff Size: Normal)   Pulse 97   Temp 98.6 F (37 C) (Oral)   Resp 16   Ht 5\' 9"  (1.753 m)   Wt 184 lb (83.5 kg)   SpO2 97%   BMI 27.17 kg/m   Constitutional: VS see above. General Appearance: alert, well-developed, well-nourished, NAD  Eyes: Normal lids and conjunctive, non-icteric sclera  Ears, Nose, Mouth, Throat: MMM, Normal external inspection ears/nares/mouth/lips/gums. TM normal bilaterally. Pharynx/tonsils no erythema, no exudate. Nasal mucosa normal. Hoarse voice   Neck: No masses, trachea midline. No thyroid enlargement. No tenderness/mass appreciated. No lymphadenopathy  Respiratory: Normal respiratory effort. no wheeze, no rhonchi, no rales  Cardiovascular: S1/S2 normal, no murmur, no rub/gallop auscultated. RRR. No lower extremity  edema.   Musculoskeletal: Gait normal. No clubbing/cyanosis of digits.   Skin: warm, dry, intact. No rash/ulcer   Psychiatric: Normal judgment/insight. Normal mood and affect. Oriented x3.     ASSESSMENT/PLAN: I think viral. Pt does not want steroids and I don't think she truly needs them, hoarseness and cough may also be worse d/t recent quitting smoking but congratulations on quitting! OTC meds as below. I'm ok to give abx if needed - pt to call if no better and would do Z-pack if sounds more like  bronchitis issue and augmentin if sounds like sinus issue, if severe symptoms/SOB would recommend f/u for CXR  Viral URI with cough - Plan: guaiFENesin-codeine (ROBITUSSIN AC) 100-10 MG/5ML syrup, ipratropium (ATROVENT) 0.03 % nasal spray  Quit smoking   Patient Instructions   Aches/Pains, Fever Acetaminophen (Tylenol) 500 mg tablets - take max 2 tablets (1000 mg) every 6 hours (4 times per day)  Ibuprofen (Motrin) 200 mg tablets - take max 4 tablets (800 mg) every 6 hours  Sinus Congestion Prescription Atrovent  As directed Cromolyn Nasal Spray (NasalCrom) 1 spray each nostril 3-4 times per day, max 6 imes per day Nasal Saline if desired Oxymetolazone (Afrin, others) sparing use due to rebound congestion - NEVER USE IN KIDS Phenylephrine PE (Sudafed) 10 mg tablets every 4 hours (or the 12-hour formulation) Diphenhydramine (Benadryl) 25 mg tablets - take max 2 tablets every 4 hours  Cough Prescription Dextromethorphan w/ Codeine as directed Dextromethorphan (Robitussin, others) - cough suppressant Guaifenesin (Robitussin, Mucinex, others) - expectorant (helps cough up mucus), can take with the above medications Lozenges w/ Benzocaine + Menthol (Cepacol) Honey - as much as you want Teas which "coat the throat" - look for ingredients Elm Bark, Licorice Root, Marshmallow Root  Other Zinc Lozenges within 24 hours of symptoms onset - mixed evidence this shortens the duration of the common cold Don't waste your money on Vitamin C or Echinacea   If no better 7-10 days after initial onset of symptoms, call us and we can discuss whether antibiotics are warranted or whether you would need an office follow-up for chest Xray or exam.     IF you received an x-ray today, you will receive an invoice from [facility]. Please contact [facility] at [phone] with questions or concerns regarding your invoice.   IF you received labwork today, you will receive an invoice from  [facility]. Please contact [facility] at [phone] with questions or concerns regarding your invoice.   Our billing staff will not be able to assist you with questions regarding bills from these companies.  You will be contacted with the lab results as soon as they are available. The fastest way to get your results is to activate your My Chart account. Instructions are located on the last page of this paperwork. If you have not heard from us regarding the results in 2 weeks, please contact this office.       Visit summary with medication list and pertinent instructions was printed for patient to review. All questions at time of visit were answered - patient instructed to contact office with any additional concerns. ER/RTC precautions were reviewed with the patient. Follow-up plan: Return if symptoms worsen or fail to improve and as directed by PCP for routine care .

## 2016-02-15 NOTE — Patient Instructions (Addendum)
Aches/Pains, Fever Acetaminophen (Tylenol) 500 mg tablets - take max 2 tablets (1000 mg) every 6 hours (4 times per day)  Ibuprofen (Motrin) 200 mg tablets - take max 4 tablets (800 mg) every 6 hours  Sinus Congestion Prescription Atrovent  As directed Cromolyn Nasal Spray (NasalCrom) 1 spray each nostril 3-4 times per day, max 6 imes per day Nasal Saline if desired Oxymetolazone (Afrin, others) sparing use due to rebound congestion - NEVER USE IN KIDS Phenylephrine PE (Sudafed) 10 mg tablets every 4 hours (or the 12-hour formulation) Diphenhydramine (Benadryl) 25 mg tablets - take max 2 tablets every 4 hours  Cough Prescription Dextromethorphan w/ Codeine as directed Dextromethorphan (Robitussin, others) - cough suppressant Guaifenesin (Robitussin, Mucinex, others) - expectorant (helps cough up mucus), can take with the above medications Lozenges w/ Benzocaine + Menthol (Cepacol) Honey - as much as you want Teas which "coat the throat" - look for ingredients Elm Bark, Licorice Root, Marshmallow Root  Other Zinc Lozenges within 24 hours of symptoms onset - mixed evidence this shortens the duration of the common cold Don't waste your money on Vitamin C or Echinacea   If no better 7-10 days after initial onset of symptoms, call us and we can discuss whether antibiotics are warranted or whether you would need an office follow-up for chest Xray or exam.     IF you received an x-ray today, you will receive an invoice from [facility]. Please contact [facility] at [phone] with questions or concerns regarding your invoice.   IF you received labwork today, you will receive an invoice from [facility]. Please contact [facility] at [phone] with questions or concerns regarding your invoice.   Our billing staff will not be able to assist you with questions regarding bills from these companies.  You will be contacted with the lab results as  soon as they are available. The fastest way to get your results is to activate your My Chart account. Instructions are located on the last page of this paperwork. If you have not heard from us regarding the results in 2 weeks, please contact this office.

## 2016-02-16 NOTE — Telephone Encounter (Signed)
Patient notified of results.

## 2016-03-12 ENCOUNTER — Ambulatory Visit (INDEPENDENT_AMBULATORY_CARE_PROVIDER_SITE_OTHER): Payer: BLUE CROSS/BLUE SHIELD | Admitting: Family Medicine

## 2016-03-12 ENCOUNTER — Encounter: Payer: Self-pay | Admitting: Family Medicine

## 2016-03-12 VITALS — BP 124/68 | HR 88 | Temp 98.4°F | Resp 16 | Ht 69.0 in | Wt 185.0 lb

## 2016-03-12 DIAGNOSIS — R300 Dysuria: Secondary | ICD-10-CM | POA: Diagnosis not present

## 2016-03-12 DIAGNOSIS — F4321 Adjustment disorder with depressed mood: Secondary | ICD-10-CM | POA: Diagnosis not present

## 2016-03-12 DIAGNOSIS — F411 Generalized anxiety disorder: Secondary | ICD-10-CM | POA: Diagnosis not present

## 2016-03-12 DIAGNOSIS — Z87891 Personal history of nicotine dependence: Secondary | ICD-10-CM

## 2016-03-12 LAB — POCT URINALYSIS DIP (MANUAL ENTRY)
Bilirubin, UA: NEGATIVE
Glucose, UA: NEGATIVE
Leukocytes, UA: NEGATIVE
Nitrite, UA: NEGATIVE
Protein Ur, POC: NEGATIVE
Spec Grav, UA: 1.025
Urobilinogen, UA: 0.2
pH, UA: 5.5

## 2016-03-12 MED ORDER — ESCITALOPRAM OXALATE 10 MG PO TABS: 10.0000 mg | ORAL_TABLET | Freq: Every day | ORAL | 1 refills | Status: DC

## 2016-03-12 MED ORDER — BUPROPION HCL ER (SR) 150 MG PO TB12: 150.0000 mg | ORAL_TABLET | Freq: Two times a day (BID) | ORAL | 1 refills | Status: DC

## 2016-03-12 MED ORDER — RANITIDINE HCL 150 MG PO TABS: 150.0000 mg | ORAL_TABLET | Freq: Every day | ORAL | 2 refills | Status: DC

## 2016-03-12 MED ORDER — RANITIDINE HCL 150 MG PO TABS: 150.0000 mg | ORAL_TABLET | Freq: Every day | ORAL | 0 refills | Status: DC

## 2016-03-12 MED ORDER — BUPROPION HCL ER (SR) 150 MG PO TB12: 150.0000 mg | ORAL_TABLET | Freq: Two times a day (BID) | ORAL | 2 refills | Status: DC

## 2016-03-12 MED ORDER — LORAZEPAM 0.5 MG PO TABS: 0.2500 mg | ORAL_TABLET | Freq: Two times a day (BID) | ORAL | 0 refills | Status: DC | PRN

## 2016-03-12 NOTE — Patient Instructions (Signed)
I have sent your urine out for culture. The in-house testing showed no evidence of urinary tract infection.  Ativan 0.25 (1/2 tablet) take first dose at home to ensure medication doesn't cause drowsiness.  If no sedation occurs, you may take up to twice daily as needed.  Continue Wellbutrin 150 mg twice daily.  Add escitalopram 10 mg once daily for depression and anxiety.  Return for follow-up of depression in 2 months .  I have refilled your Zantac.   IF you received an x-ray today, you will receive an invoice from [facility]. Please contact [facility] at [phone] with questions or concerns regarding your invoice.   IF you received labwork today, you will receive an invoice from [facility]. Please contact [facility] at [phone] with questions or concerns regarding your invoice.   Our billing staff will not be able to assist you with questions regarding bills from these companies.  You will be contacted with the lab results as soon as they are available. The fastest way to get your results is to activate your My Chart account. Instructions are located on the last page of this paperwork. If you have not heard from us regarding the results in 2 weeks, please contact this office.

## 2016-03-12 NOTE — Progress Notes (Signed)
Patient ID: Crystal Medina, female    DOB: 1960-08-11, 55 y.o.   MRN: [id number]  PCP: Crystal CourtsKimberly Tamsin Nader, FNP  Chief Complaint  Patient presents with  . Follow-up    Subjective:   HPI 22[name], 55 year old female presents for medication follow-up since starting Wellbutrin 6 weeks ago for smoking cessation. 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  Smoking cessation Today she report successfully being smoke free for over 1 month. She continues to take the Wellbutrin in addition to chewing nicotine gum. Stills has cravings occasional, although manageable.  Depression, Situational  Reports increased non-managable depression. This the first Thanksgiving and Christmas without her mother who died unexpectedly earlier this year. 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 Reports episodes of "shakiness and nervousness". Often during the day. she feels like she is not accomplishing any tasks, "feels like I'm going in circles". If she returns to [place], [place]home" for the holidays, this will be her first time returning since her mother died.  Review of Systems See HPI  Patient Active Problem List   Diagnosis Date Noted  . AR (allergic rhinitis) 08/01/2015     Prior to Admission medications   Medication Sig Start Date End Date Taking? Authorizing Provider  albuterol (PROVENTIL HFA;VENTOLIN HFA) 108 (90 Base) MCG/ACT inhaler Inhale 2 puffs into the lungs every 4 (four) hours as needed for wheezing or shortness of breath (cough, shortness of breath or wheezing.). 02/08/16   Doyle AskewKimberly Stephenia Jolynda Townley, FNP  buPROPion (WELLBUTRIN SR) 150 MG 12 hr tablet Take 1 tablet (150 mg total) by mouth 2 (two) times daily. 02/08/16   Doyle AskewKimberly Stephenia Alandria Butkiewicz, FNP  guaiFENesin-codeine (ROBITUSSIN AC) 100-10 MG/5ML syrup Take 5-10 mLs by mouth 4 (four) times daily as needed for cough. 02/15/16   Sunnie NielsenNatalie Alexander, DO  ipratropium  (ATROVENT) 0.03 % nasal spray Place 2 sprays into both nostrils 3 (three) times daily as needed for rhinitis (nasal congestion). 02/15/16   Sunnie NielsenNatalie Alexander, DO  loratadine-pseudoephedrine (CLARITIN-D 24-HOUR) 10-240 MG per 24 hr tablet Take 1 tablet by mouth daily. Reported on 07/25/2015    Historical Provider, MD  ranitidine (ZANTAC) 150 MG tablet Take 1 tablet (150 mg total) by mouth daily. 02/04/16   Doyle AskewKimberly Stephenia Makih Stefanko, FNP     Allergies  Allergen Reactions  . Sulfa Antibiotics Anaphylaxis       Objective:  Physical Exam  Constitutional: She appears well-developed and well-nourished.  HENT:  Head: Normocephalic and atraumatic.  Cardiovascular: Normal rate, regular rhythm, normal heart sounds and intact distal pulses.   Pulmonary/Chest: Effort normal and breath sounds normal. She has no wheezes.  Lungs sounds significantly improved since last exam  Psychiatric: Judgment and thought content normal. Her mood appears anxious. Her speech is rapid and/or pressured. She is not agitated, not aggressive, not hyperactive, not slowed, not withdrawn, not actively hallucinating and not combative. Cognition and memory are normal. She exhibits a depressed mood.  Very tearful and emotional when discussing holidays and the death of her mother this year Visibly depressed and agitated. Denies thoughts of self harm Positive for sleep disturbances-difficulty falling asleep than lack of desire to get up and get going  She is attentive.    Today's Vitals   03/12/16 1045  BP: 124/68  Pulse: 88  Resp: 16  Temp: 98.4 F (36.9 C)  TempSrc: Oral  SpO2: 99%  Weight: 185 lb (83.9 kg)  Height: 5\' 9"  (1.753 m)  Assessment & Plan:  1. Dysuria - POCT urinalysis dipstick - Urine culture Plan:  Increase intake of water. No infection present with POCT. Urine culture pending.  2. Depression, situational, discussed returning home for the holidays and spending time with sister. Pt is currently  separated, and living in [place] separated from family and no friends. Plan:  Add Escitalopram (Lexapro) 10 mg tablet, once daily   3. Anxiety state Plan: Lorazepam (Ativan) 0.5 mg, take 0.25 mg every 12 hours as needed for anxiety and or panic attack-like episodes.  4. Discontinued smoking  Plan: -Continue Wellbutrin 150 mg twice daily. -Continue nicotine gum or patches.  Return in 2 months for depression and anxiety follow-up.  Godfrey PickKimberly S. Tiburcio PeaHarris, [name], [name] [facility]

## 2016-03-14 LAB — URINE CULTURE

## 2016-03-20 ENCOUNTER — Telehealth: Payer: Self-pay

## 2016-03-20 NOTE — Telephone Encounter (Signed)
PATIENT STATES SHE SAW KIMBERLY [name] ON Wednesday AND SHE REFILLED HER RANITIDINE (ZANTAC) 150 mg FOR HER HEART BURN. SHE WOULD LIKE KIMBERLY TO WRITE HER A 90 DAY PRESCRIPTION BEFORE HER [employer] EXPIRES ON 03/30/2016. BEST PHONE [phone] (CELL)  PHARMACY CHOICE IS [facility] ON [address]. MBC

## 2016-03-20 NOTE — Telephone Encounter (Signed)
PLEASE DISREGARD THIS PREVIOUS MESSAGE PER THE PATIENT. THE PHARMACY TOLD HER THEY GOT A REFILL REQUEST ON 03/12/2016 AND 03/13/2016 FOR THE RANITIDINE (ZANTAC) 150 mg. THEY WILL COMBINE THE PRESCRIPTIONS TO GIVE HER THE CORRECT AMOUNT. (I WOULD STILL LIKE KIMBERLY [name] TO SEE THIS MESSAGE FOR HER REVIEW) BEST PHONE IF QUESTIONS: [phone] (PATIENT'S CELL) MBC

## 2016-03-25 NOTE — Telephone Encounter (Signed)
fyi

## 2016-05-07 ENCOUNTER — Ambulatory Visit: Payer: BLUE CROSS/BLUE SHIELD | Admitting: Family Medicine

## 2016-09-29 ENCOUNTER — Encounter: Payer: Self-pay | Admitting: Physician Assistant

## 2016-09-29 ENCOUNTER — Ambulatory Visit: Payer: BLUE CROSS/BLUE SHIELD

## 2016-09-29 ENCOUNTER — Ambulatory Visit (INDEPENDENT_AMBULATORY_CARE_PROVIDER_SITE_OTHER): Payer: BLUE CROSS/BLUE SHIELD | Admitting: Physician Assistant

## 2016-09-29 VITALS — BP 118/78 | HR 90 | Temp 98.3°F | Resp 16 | Ht 67.25 in | Wt 183.2 lb

## 2016-09-29 DIAGNOSIS — Z13220 Encounter for screening for lipoid disorders: Secondary | ICD-10-CM | POA: Diagnosis not present

## 2016-09-29 DIAGNOSIS — J309 Allergic rhinitis, unspecified: Secondary | ICD-10-CM | POA: Diagnosis not present

## 2016-09-29 DIAGNOSIS — J069 Acute upper respiratory infection, unspecified: Secondary | ICD-10-CM | POA: Diagnosis not present

## 2016-09-29 DIAGNOSIS — B9789 Other viral agents as the cause of diseases classified elsewhere: Secondary | ICD-10-CM

## 2016-09-29 DIAGNOSIS — F4321 Adjustment disorder with depressed mood: Secondary | ICD-10-CM

## 2016-09-29 DIAGNOSIS — Z130 Encounter for screening for diseases of the blood and blood-forming organs and certain disorders involving the immune mechanism: Secondary | ICD-10-CM | POA: Diagnosis not present

## 2016-09-29 DIAGNOSIS — Z1321 Encounter for screening for nutritional disorder: Secondary | ICD-10-CM

## 2016-09-29 DIAGNOSIS — Z13228 Encounter for screening for other metabolic disorders: Secondary | ICD-10-CM

## 2016-09-29 DIAGNOSIS — Z0000 Encounter for general adult medical examination without abnormal findings: Secondary | ICD-10-CM | POA: Diagnosis not present

## 2016-09-29 DIAGNOSIS — Z1329 Encounter for screening for other suspected endocrine disorder: Secondary | ICD-10-CM

## 2016-09-29 DIAGNOSIS — Z13820 Encounter for screening for osteoporosis: Secondary | ICD-10-CM

## 2016-09-29 MED ORDER — BUPROPION HCL ER (SR) 150 MG PO TB12: 150.0000 mg | ORAL_TABLET | Freq: Two times a day (BID) | ORAL | 1 refills | Status: DC

## 2016-09-29 MED ORDER — IPRATROPIUM BROMIDE 0.03 % NA SOLN: 2.0000 | Freq: Three times a day (TID) | NASAL | 11 refills | Status: DC | PRN

## 2016-09-29 NOTE — Progress Notes (Signed)
PRIMARY CARE AT [facility] [address], [place] [address] [phone]  Date:  09/29/2016   Name:  Crystal Medina   DOB:  01-23-1961   MRN:  [id number]  PCP:  Scot Jun, FNP    History of Present Illness:  Crystal Medina is a 56 y.o. female patient who presents to PCP with  Chief Complaint  Patient presents with  . Annual Exam    no pap      Diet: she is eating low carb,  Mainly eating fruits and vegetables.  No sugary drinks.  Caffeine intake: cup of coffee.  Water intake is 2 glasses per day.  BM: not daily.  Some constipation taking miralax which helps.  No blood or black stool.    URINATION: going more than normal.  Hematuria or dysuria.  SLEEP: not sleeping well.  Takes melatonin 68m.  11pm, wakes at 3am.  Overheated and sweats at times.    Menses: years ago lmp.  dr. RRico Junkerwith gynecology.  Mammogram normal with [facility] breast surgery.  Benign breast lesion.  Pap smear normal.  No std hx.  Not sexually active at this time.   SOCIAL ACTIVITY: go out with people, comedy show. Lunch.  Doctoral aspiration, however feels a bit underwhelmed and uninterested in obtaining her medical degree.   She has noticed that she has felt more depressed lately, though "she does not want to talk about it".  She has been terminated from her job, as well as the loss of her mother last year.  And she has also ended a long-term relationship 1.5 years ago.  She is open to counseling.  Currently taking wellbutrin bid.  But has not taken the ativan, or the lexapro.  Currently not smoking.  Patient Active Problem List   Diagnosis Date Noted  . AR (allergic rhinitis) 08/01/2015    Past Medical History:  Diagnosis Date  . Allergy   . Asthma   . Depression   . Diabetes mellitus without complication ([place]   . GERD (gastroesophageal reflux disease)     Past Surgical History:  Procedure Laterality Date  . BREAST SURGERY    . LAPAROSCOPIC GASTRIC SLEEVE RESECTION      Social History   Substance Use Topics  . Smoking status: Former Smoker    Packs/day: 0.50    Years: 30.00  . Smokeless tobacco: Never Used  . Alcohol use 1.2 oz/week    2 Standard drinks or equivalent per week    Family History  Problem Relation Age of Onset  . Diabetes Mother   . Hyperlipidemia Mother   . Cancer Mother   . Hypertension Mother   . Heart disease Father   . Hyperlipidemia Father   . Hypertension Father   . Cancer Sister   . Hyperlipidemia Sister   . Hypertension Sister   . Mental illness Maternal Grandmother     Allergies  Allergen Reactions  . Sulfa Antibiotics Anaphylaxis    Medication list has been reviewed and updated.  Current Outpatient Prescriptions on File Prior to Visit  Medication Sig Dispense Refill  . albuterol (PROVENTIL HFA;VENTOLIN HFA) 108 (90 Base) MCG/ACT inhaler Inhale 2 puffs into the lungs every 4 (four) hours as needed for wheezing or shortness of breath (cough, shortness of breath or wheezing.). 1 Inhaler 1  . buPROPion (WELLBUTRIN SR) 150 MG 12 hr tablet Take 1 tablet (150 mg total) by mouth 2 (two) times daily. 90 tablet 2  . ipratropium (ATROVENT) 0.03 % nasal spray  Place 2 sprays into both nostrils 3 (three) times daily as needed for rhinitis (nasal congestion). 30 mL 0  . loratadine-pseudoephedrine (CLARITIN-D 24-HOUR) 10-240 MG per 24 hr tablet Take 1 tablet by mouth daily. Reported on 07/25/2015    . escitalopram (LEXAPRO) 10 MG tablet Take 1 tablet (10 mg total) by mouth daily. (Patient not taking: Reported on 09/29/2016) 90 tablet 1  . guaiFENesin-codeine (ROBITUSSIN AC) 100-10 MG/5ML syrup Take 5-10 mLs by mouth 4 (four) times daily as needed for cough. (Patient not taking: Reported on 09/29/2016) 118 mL 0  . LORazepam (ATIVAN) 0.5 MG tablet Take 0.5 tablets (0.25 mg total) by mouth every 12 (twelve) hours as needed for anxiety. (Patient not taking: Reported on 09/29/2016) 45 tablet 0  . ranitidine (ZANTAC) 150 MG tablet Take 1 tablet (150 mg total)  by mouth daily. (Patient not taking: Reported on 09/29/2016) 90 tablet 2   No current facility-administered medications on file prior to visit.     ROS ROS otherwise unremarkable unless listed above.  Physical Examination: BP 118/78   Pulse 90   Temp 98.3 F (36.8 C) (Oral)   Resp 16   Ht '5\' 9"'  (1.753 m)   Wt 183 lb 3.2 oz (83.1 kg)   SpO2 99%   BMI 27.05 kg/m  Ideal Body Weight: Weight in (lb) to have BMI = 25: 168.9  Physical Exam  Constitutional: She is oriented to person, place, and time. She appears well-developed and well-nourished. No distress.  HENT:  Head: Normocephalic and atraumatic.  Right Ear: Tympanic membrane, external ear and ear canal normal.  Left Ear: Tympanic membrane, external ear and ear canal normal.  Nose: Right sinus exhibits no maxillary sinus tenderness and no frontal sinus tenderness. Left sinus exhibits no maxillary sinus tenderness and no frontal sinus tenderness.  Mouth/Throat: Oropharynx is clear and moist. No uvula swelling. No oropharyngeal exudate, posterior oropharyngeal edema or posterior oropharyngeal erythema.  Eyes: Conjunctivae and EOM are normal. Pupils are equal, round, and reactive to light.  Neck: Normal range of motion. Neck supple. No thyromegaly present.  Cardiovascular: Normal rate, regular rhythm, normal heart sounds and intact distal pulses.  Exam reveals no gallop, no distant heart sounds and no friction rub.   No murmur heard. Pulmonary/Chest: Effort normal and breath sounds normal. No respiratory distress. She has no decreased breath sounds. She has no wheezes. She has no rhonchi.  Abdominal: Soft. Bowel sounds are normal. She exhibits no distension and no mass. There is no tenderness.  Musculoskeletal: Normal range of motion. She exhibits no edema or tenderness.  Lymphadenopathy:       Head (right side): No submandibular, no tonsillar, no preauricular and no posterior auricular adenopathy present.       Head (left side): No  submandibular, no tonsillar, no preauricular and no posterior auricular adenopathy present.    She has no cervical adenopathy.  Neurological: She is alert and oriented to person, place, and time. No cranial nerve deficit. She exhibits normal muscle tone. Coordination normal.  Skin: Skin is warm and dry. She is not diaphoretic.  Psychiatric: She has a normal mood and affect. Her behavior is normal.     Assessment and Plan: Desiraye Rolfson is a 56 y.o. female who is here today for cc of physical exam.   Annual physical exam - Plan: ipratropium (ATROVENT) 0.03 % nasal spray, CBC, CMP14+EGFR, Thyroid Panel With TSH, VITAMIN D 25 Hydroxy (Vit-D Deficiency, Fractures), Lipid panel, CANCELED: DG Chest 1 View  Viral URI  with cough  Screening for deficiency anemia - Plan: CBC  Screening for metabolic disorder - Plan: CMP14+EGFR  Encounter for vitamin deficiency screening - Plan: VITAMIN D 25 Hydroxy (Vit-D Deficiency, Fractures)  Screening for osteoporosis - Plan: VITAMIN D 25 Hydroxy (Vit-D Deficiency, Fractures)  Screening for lipid disorders - Plan: Lipid panel  Screening for thyroid disorder - Plan: Thyroid Panel With TSH  Allergic rhinitis, unspecified seasonality, unspecified trigger - Plan: ipratropium (ATROVENT) 0.03 % nasal spray  Situational depression  Ivar Drape, PA-C [facility] 7/6/20188:46 AM

## 2016-09-29 NOTE — Patient Instructions (Addendum)
We are going to try to use melatonin at an increase dose of the .5mg .   You can also try taking half tablet of the ativan for your sleep.  I will contact you with some recommendations of a therapist, once I get the lab results as well.  Please try to make a schedule for yourself daily of things you will do that day.  Include a volunteer work, applying for your work, the looking up for places to apply.  And time for your work on the dissertation.  Once the time is up, stop.   Increase your hydration to 64oz of water per day.  Keeping You Healthy  Get These Tests  Blood Pressure- Have your blood pressure checked by your healthcare provider at least once a year.  Normal blood pressure is 120/80.  Weight- Have your body mass index (BMI) calculated to screen for obesity.  BMI is a measure of body fat based on height and weight.  You can calculate your own BMI at https://www.west-esparza.com/www.nhlbisupport.com/bmi/  Cholesterol- Have your cholesterol checked every year.  Diabetes- Have your blood sugar checked every year if you have high blood pressure, high cholesterol, a family history of diabetes or if you are overweight.  Pap Test - Have a pap test every 1 to 5 years if you have been sexually active.  If you are older than 65 and recent pap tests have been normal you may not need additional pap tests.  In addition, if you have had a hysterectomy  for benign disease additional pap tests are not necessary.  Mammogram-Yearly mammograms are essential for early detection of breast cancer  Screening for Colon Cancer- Colonoscopy starting at age 56. Screening may begin sooner depending on your family history and other health conditions.  Follow up colonoscopy as directed by your Gastroenterologist.  Screening for Osteoporosis- Screening begins at age 56 with bone density scanning, sooner if you are at higher risk for developing Osteoporosis.  Get these medicines  Calcium with Vitamin D- Your body requires 1200-1500 mg of  Calcium a day and [phone] IU of Vitamin D a day.  You can only absorb 500 mg of Calcium at a time therefore Calcium must be taken in 2 or 3 separate doses throughout the day.  Hormones- Hormone therapy has been associated with increased risk for certain cancers and heart disease.  Talk to your healthcare provider about if you need relief from menopausal symptoms.  Aspirin- Ask your healthcare provider about taking Aspirin to prevent Heart Disease and Stroke.  Get these Immuniztions  Flu shot- Every fall  Pneumonia shot- Once after the age of 465; if you are younger ask your healthcare provider if you need a pneumonia shot.  Tetanus- Every ten years.  Zostavax- Once after the age of 56 to prevent shingles.  Take these steps  Don't smoke- Your healthcare provider can help you quit. For tips on how to quit, ask your healthcare provider or go to www.smokefree.gov or call 1-800 QUIT-NOW.  Be physically active- Exercise 5 days a week for a minimum of 30 minutes.  If you are not already physically active, start slow and gradually work up to 30 minutes of moderate physical activity.  Try walking, dancing, bike riding, swimming, etc.  Eat a healthy diet- Eat a variety of healthy foods such as fruits, vegetables, whole grains, low fat milk, low fat cheeses, yogurt, lean meats, chicken, fish, eggs, dried beans, tofu, etc.  For more information go to www.thenutritionsource.org  Dental visit-  Brush and floss teeth twice daily; visit your dentist twice a year.  Eye exam- Visit your Optometrist or Ophthalmologist yearly.  Drink alcohol in moderation- Limit alcohol intake to one drink or less a day.  Never drink and drive.  Depression- Your emotional health is as important as your physical health.  If you're feeling down or losing interest in things you normally enjoy, please talk to your healthcare provider.  Seat Belts- can save your life; always wear one  Smoke/Carbon Monoxide detectors- These  detectors need to be installed on the appropriate level of your home.  Replace batteries at least once a year.  Violence- If anyone is threatening or hurting you, please tell your healthcare provider.  Living Will/ Health care power of attorney- Discuss with your healthcare provider and family.  IF you received an x-ray today, you will receive an invoice from [facility]. Please contact [facility] at [phone] with questions or concerns regarding your invoice.   IF you received labwork today, you will receive an invoice from [facility]. Please contact [facility] at [phone] with questions or concerns regarding your invoice.   Our billing staff will not be able to assist you with questions regarding bills from these companies.  You will be contacted with the lab results as soon as they are available. The fastest way to get your results is to activate your My Chart account. Instructions are located on the last page of this paperwork. If you have not heard from [place] regarding the results in 2 weeks, please contact this office.

## 2016-09-30 LAB — CMP14+EGFR
ALT: 19 IU/L (ref 0–32)
AST: 24 IU/L (ref 0–40)
Albumin/Globulin Ratio: 1.5 (ref 1.2–2.2)
Albumin: 4.8 g/dL (ref 3.5–5.5)
Alkaline Phosphatase: 73 IU/L (ref 39–117)
BUN/Creatinine Ratio: 18 (ref 9–23)
BUN: 17 mg/dL (ref 6–24)
Bilirubin Total: 1 mg/dL (ref 0.0–1.2)
CO2: 23 mmol/L (ref 20–29)
Calcium: 9.9 mg/dL (ref 8.7–10.2)
Chloride: 96 mmol/L (ref 96–106)
Creatinine, Ser: 0.93 mg/dL (ref 0.57–1.00)
GFR calc Af Amer: 80 mL/min/{1.73_m2} (ref >59)
GFR calc non Af Amer: 69 mL/min/{1.73_m2} (ref >59)
Globulin, Total: 3.2 g/dL (ref 1.5–4.5)
Glucose: 90 mg/dL (ref 65–99)
Potassium: 4.6 mmol/L (ref 3.5–5.2)
Sodium: 139 mmol/L (ref 134–144)
Total Protein: 8 g/dL (ref 6.0–8.5)

## 2016-09-30 LAB — LIPID PANEL
Chol/HDL Ratio: 2.8 ratio (ref 0.0–4.4)
Cholesterol, Total: 254 mg/dL — ABNORMAL HIGH (ref 100–199)
HDL: 91 mg/dL (ref >39)
LDL Calculated: 140 mg/dL — ABNORMAL HIGH (ref 0–99)
Triglycerides: 117 mg/dL (ref 0–149)
VLDL Cholesterol Cal: 23 mg/dL (ref 5–40)

## 2016-09-30 LAB — CBC
Hematocrit: 40.3 % (ref 34.0–46.6)
Hemoglobin: 13.4 g/dL (ref 11.1–15.9)
MCH: 34.7 pg — ABNORMAL HIGH (ref 26.6–33.0)
MCHC: 33.3 g/dL (ref 31.5–35.7)
MCV: 104 fL — ABNORMAL HIGH (ref 79–97)
Platelets: 325 10*3/uL (ref 150–379)
RBC: 3.86 x10E6/uL (ref 3.77–5.28)
RDW: 12.8 % (ref 12.3–15.4)
WBC: 4.4 10*3/uL (ref 3.4–10.8)

## 2016-09-30 LAB — THYROID PANEL WITH TSH
Free Thyroxine Index: 1.4 (ref 1.2–4.9)
T3 Uptake Ratio: 29 % (ref 24–39)
T4, Total: 4.8 ug/dL (ref 4.5–12.0)
TSH: 1.69 u[IU]/mL (ref 0.450–4.500)

## 2016-09-30 LAB — VITAMIN D 25 HYDROXY (VIT D DEFICIENCY, FRACTURES): Vit D, 25-Hydroxy: 39.7 ng/mL (ref 30.0–100.0)

## 2016-10-03 ENCOUNTER — Telehealth: Payer: Self-pay | Admitting: Physician Assistant

## 2016-10-03 NOTE — Telephone Encounter (Signed)
Pt is needing to talk with Judeth CornfieldStephanie about going back on Zantac and needs to get her albuteral inhaler refilled   Best number [phone]

## 2016-10-04 ENCOUNTER — Telehealth: Payer: Self-pay | Admitting: Physician Assistant

## 2016-10-04 MED ORDER — ALBUTEROL SULFATE HFA 108 (90 BASE) MCG/ACT IN AERS: 2.0000 | INHALATION_SPRAY | RESPIRATORY_TRACT | 1 refills | Status: DC | PRN

## 2016-10-04 MED ORDER — RANITIDINE HCL 150 MG PO TABS: 150.0000 mg | ORAL_TABLET | Freq: Every day | ORAL | 2 refills | Status: DC

## 2016-10-04 NOTE — Telephone Encounter (Signed)
Refilled

## 2016-10-04 NOTE — Telephone Encounter (Signed)
Stephanie, pls see this message. The patient had the last Albuterol + 1  refill on 02-08-16. Thanks.

## 2016-10-10 ENCOUNTER — Telehealth: Payer: Self-pay | Admitting: Physician Assistant

## 2016-10-10 NOTE — Telephone Encounter (Signed)
Pt is needing her lab results from about a week ago   Best number [phone]

## 2016-10-13 NOTE — Telephone Encounter (Signed)
Please review lab results so that patient can be notified.  Thanks

## 2016-10-14 NOTE — Telephone Encounter (Signed)
Pt called back this morning stating she didn't want refills because you did this at her physical appointment she stated she needs to talk to you about another medication and talk to you about something you all discussed at her CPE vist. I spoke with Judeth CornfieldStephanie and advised her of this and she stated to let the patient know she will call her back. The Patient asked would she be calling abck today and I told her I she didn't say, and I caught her when she came out of a patients room. She thanked me and said she will wait on her call back.

## 2016-10-14 NOTE — Telephone Encounter (Signed)
Left voicemail about the labs and recommendations.  These were not acute findings.  Advised to look at psychologytoday.[email or web address] for therapist in the area with demographic that she would prefer, and specializing in her interest.

## 2016-10-19 NOTE — Telephone Encounter (Signed)
Error

## 2016-11-24 ENCOUNTER — Other Ambulatory Visit: Payer: Self-pay | Admitting: Family Medicine

## 2016-12-05 ENCOUNTER — Telehealth: Payer: Self-pay | Admitting: Physician Assistant

## 2016-12-05 ENCOUNTER — Other Ambulatory Visit: Payer: Self-pay

## 2016-12-05 MED ORDER — RANITIDINE HCL 150 MG PO TABS: 150.0000 mg | ORAL_TABLET | Freq: Two times a day (BID) | ORAL | 1 refills | Status: DC

## 2016-12-05 NOTE — Telephone Encounter (Signed)
[name] is calling to confirm the dosage of the ranitidine the patient states that this has been increased to two tablets a day and the pharmacy has 1 tablet a day and patient is out   [facility] to [facility]   [phone]

## 2016-12-05 NOTE — Telephone Encounter (Signed)
PT CALLING FOR REFILL ON ZANTAC PLEASE RESPOND SHES UPSET SHE STATES THAT STEPHANIE NEVER CALLS HER PATIENTS BACK AND THAT HER PHARMACY SENT A REQUEST IN FOR REFILL

## 2016-12-08 NOTE — Telephone Encounter (Signed)
Zantac was filled on 12/05/2016

## 2017-03-06 ENCOUNTER — Telehealth: Payer: Self-pay

## 2017-03-06 NOTE — Telephone Encounter (Signed)
Please advise 

## 2017-03-06 NOTE — Telephone Encounter (Signed)
Pt. States "[place] in [place] increased my Wellbutrin SR to 200 mg. I won't be seeing him anymore." Request Judeth CornfieldStephanie English PA, continue this dosage and authorize 90 day refills.

## 2017-03-11 NOTE — Telephone Encounter (Signed)
She would need to come in.

## 2017-03-12 NOTE — Telephone Encounter (Signed)
Please schedule

## 2017-05-31 ENCOUNTER — Other Ambulatory Visit: Payer: Self-pay | Admitting: Physician Assistant

## 2017-07-08 ENCOUNTER — Encounter: Payer: Self-pay | Admitting: Physician Assistant

## 2017-07-14 ENCOUNTER — Encounter: Payer: Self-pay | Admitting: Physician Assistant

## 2017-07-14 ENCOUNTER — Ambulatory Visit (INDEPENDENT_AMBULATORY_CARE_PROVIDER_SITE_OTHER): Payer: BLUE CROSS/BLUE SHIELD | Admitting: Physician Assistant

## 2017-07-14 VITALS — BP 106/70 | HR 78 | Temp 98.2°F | Resp 16 | Ht 67.25 in | Wt 182.0 lb

## 2017-07-14 DIAGNOSIS — L299 Pruritus, unspecified: Secondary | ICD-10-CM

## 2017-07-14 DIAGNOSIS — K219 Gastro-esophageal reflux disease without esophagitis: Secondary | ICD-10-CM | POA: Diagnosis not present

## 2017-07-14 DIAGNOSIS — H61891 Other specified disorders of right external ear: Secondary | ICD-10-CM | POA: Diagnosis not present

## 2017-07-14 DIAGNOSIS — J309 Allergic rhinitis, unspecified: Secondary | ICD-10-CM | POA: Diagnosis not present

## 2017-07-14 DIAGNOSIS — J302 Other seasonal allergic rhinitis: Secondary | ICD-10-CM | POA: Diagnosis not present

## 2017-07-14 DIAGNOSIS — F4321 Adjustment disorder with depressed mood: Secondary | ICD-10-CM

## 2017-07-14 DIAGNOSIS — Z8639 Personal history of other endocrine, nutritional and metabolic disease: Secondary | ICD-10-CM

## 2017-07-14 LAB — POCT SKIN KOH: Skin KOH, POC: NEGATIVE

## 2017-07-14 MED ORDER — IPRATROPIUM BROMIDE 0.03 % NA SOLN: 2.0000 | Freq: Three times a day (TID) | NASAL | 11 refills | Status: DC | PRN

## 2017-07-14 MED ORDER — OMEPRAZOLE 20 MG PO CPDR: 20.0000 mg | DELAYED_RELEASE_CAPSULE | Freq: Every day | ORAL | 1 refills | Status: DC

## 2017-07-14 MED ORDER — ALBUTEROL SULFATE HFA 108 (90 BASE) MCG/ACT IN AERS: 2.0000 | INHALATION_SPRAY | RESPIRATORY_TRACT | 11 refills | Status: DC | PRN

## 2017-07-14 MED ORDER — TRIAMCINOLONE ACETONIDE 0.025 % EX OINT: 1.0000 "application " | TOPICAL_OINTMENT | Freq: Two times a day (BID) | CUTANEOUS | 0 refills | Status: DC

## 2017-07-14 MED ORDER — CETIRIZINE HCL 10 MG PO TABS: 10.0000 mg | ORAL_TABLET | Freq: Every day | ORAL | 11 refills | Status: DC

## 2017-07-14 NOTE — Patient Instructions (Addendum)
   Apply this for no longer than 2 weeks consistently to the right ear.     Food Choices for Gastroesophageal Reflux Disease, Adult When you have gastroesophageal reflux disease (GERD), the foods you eat and your eating habits are very important. Choosing the right foods can help ease your discomfort. What guidelines do I need to follow?  Choose fruits, vegetables, whole grains, and low-fat dairy products.  Choose low-fat meat, fish, and poultry.  Limit fats such as oils, salad dressings, butter, nuts, and avocado.  Keep a food diary. This helps you identify foods that cause symptoms.  Avoid foods that cause symptoms. These may be different for everyone.  Eat small meals often instead of 3 large meals a day.  Eat your meals slowly, in a place where you are relaxed.  Limit fried foods.  Cook foods using methods other than frying.  Avoid drinking alcohol.  Avoid drinking large amounts of liquids with your meals.  Avoid bending over or lying down until 2-3 hours after eating. What foods are not recommended? These are some foods and drinks that may make your symptoms worse: Vegetables Tomatoes. Tomato juice. Tomato and spaghetti sauce. Chili peppers. Onion and garlic. Horseradish. Fruits Oranges, grapefruit, and lemon (fruit and juice). Meats High-fat meats, fish, and poultry. This includes hot dogs, ribs, ham, sausage, salami, and bacon. Dairy Whole milk and chocolate milk. Sour cream. Cream. Butter. Ice cream. Cream cheese. Drinks Coffee and tea. Bubbly (carbonated) drinks or energy drinks. Condiments Hot sauce. Barbecue sauce. Sweets/Desserts Chocolate and cocoa. Donuts. Peppermint and spearmint. Fats and Oils High-fat foods. This includes [place] fries and potato chips. Other Vinegar. Strong spices. This includes black pepper, white pepper, red pepper, cayenne, curry powder, cloves, ginger, and chili powder. The items listed above may not be a complete list of foods  and drinks to avoid. Contact your dietitian for more information. This information is not intended to replace advice given to you by your health care provider. Make sure you discuss any questions you have with your health care provider. Document Released: 09/16/2011 Document Revised: 08/23/2015 Document Reviewed: 01/19/2013 Elsevier Interactive Patient Education  2017 [employer]  IF you received an x-ray today, you will receive an invoice from [facility]. Please contact [facility] at [phone] with questions or concerns regarding your invoice.   IF you received labwork today, you will receive an invoice from [facility]. Please contact [facility] at [phone] with questions or concerns regarding your invoice.   Our billing staff will not be able to assist you with questions regarding bills from these companies.  You will be contacted with the lab results as soon as they are available. The fastest way to get your results is to activate your My Chart account. Instructions are located on the last page of this paperwork. If you have not heard from us regarding the results in 2 weeks, please contact this office.

## 2017-07-14 NOTE — Progress Notes (Signed)
PRIMARY CARE AT [facility] [address] [place] [address] [phone]  Date:  07/14/2017   Name:  Tawana Pasch   DOB:  [birth year]   MRN:  [id number]  PCP:  Patient, No Pcp Per    History of Present Illness:  Emri Sample is a 57 y.o. female patient who presents to PCP with  Chief Complaint  Patient presents with  . Medication Refill    Albuterol, Atrovent and Zantac  . toe numbness    her 3rd and 4th toe on right and left foot have numbness      --she has been having some burning in her chest associated with food.  The zantac does help.  This is bothered by french fries, eating and laying down too soon.   --she will use the albuterol during seasons of pollen ( at this time), and headache.  She may use the albuterol 1-2 times per day, 3-4 times per week.   --she is taking otc claritin-D. --the nose spray is more helpful.    Toe left/ foot numbness:  Smoker in history quit.  Over 1 year cessation.  30 years.    Ear pain of right ear, with scratch about 2 weeks ago.  It is flaky and continues to itch.   Reports that she would like to start back on the wellbutrin.  This was used for her smoking cessation.  Reports that she has had a lot of transitions, with work, studying for a degree, mother's death, end of a relationship.  She is doing better, but states that this improved her symptoms.   Patient Active Problem List   Diagnosis Date Noted  . AR (allergic rhinitis) 08/01/2015    Past Medical History:  Diagnosis Date  . Allergy   . Asthma   . Depression   . Diabetes mellitus without complication (HCC)   . GERD (gastroesophageal reflux disease)     Past Surgical History:  Procedure Laterality Date  . BREAST SURGERY    . LAPAROSCOPIC GASTRIC SLEEVE RESECTION      Social History   Tobacco Use  . Smoking status: Former Smoker    Packs/day: 0.50    Years: 30.00    Pack years: 15.00  . Smokeless tobacco: Never Used  Substance Use Topics  . Alcohol use: Yes   Alcohol/week: 1.2 oz    Types: 2 Standard drinks or equivalent per week  . Drug use: No    Family History  Problem Relation Age of Onset  . Diabetes Mother   . Hyperlipidemia Mother   . Cancer Mother   . Hypertension Mother   . Heart disease Father   . Hyperlipidemia Father   . Hypertension Father   . Cancer Sister   . Hyperlipidemia Sister   . Hypertension Sister   . Mental illness Maternal Grandmother     Allergies  Allergen Reactions  . Sulfa Antibiotics Anaphylaxis    Medication list has been reviewed and updated.  Current Outpatient Medications on File Prior to Visit  Medication Sig Dispense Refill  . albuterol (PROVENTIL HFA;VENTOLIN HFA) 108 (90 Base) MCG/ACT inhaler Inhale 2 puffs into the lungs every 4 (four) hours as needed for wheezing or shortness of breath (cough, shortness of breath or wheezing.). 1 Inhaler 1  . buPROPion (WELLBUTRIN SR) 150 MG 12 hr tablet Take 1 tablet (150 mg total) by mouth 2 (two) times daily. 180 tablet 1  . ipratropium (ATROVENT) 0.03 % nasal spray Place 2 sprays into both nostrils 3 (  three) times daily as needed for rhinitis (nasal congestion). 30 mL 11  . loratadine-pseudoephedrine (CLARITIN-D 24-HOUR) 10-240 MG per 24 hr tablet Take 1 tablet by mouth daily. Reported on 07/25/2015    . ranitidine (ZANTAC) 150 MG tablet Take 1 tablet (150 mg total) by mouth 2 (two) times daily. 180 tablet 1   No current facility-administered medications on file prior to visit.     Review of Systems  Skin: Positive for rash (ear pruritus).  Psychiatric/Behavioral: Positive for depression (no si/hi).   ROS otherwise unremarkable unless listed above.  Physical Examination: BP 106/70   Pulse 78   Temp 98.2 F (36.8 C) (Oral)   Resp 16   Ht 5' 7.25" (1.708 m)   Wt 182 lb (82.6 kg)   SpO2 100%   BMI 28.29 kg/m  Ideal Body Weight: Weight in (lb) to have BMI = 25: 160.5  Physical Exam   Results for orders placed or performed in visit on  07/14/17  POCT Skin KOH  Result Value Ref Range   Skin KOH, POC Negative Negative     Assessment and Plan: Luan MooreJoeann Deamer is a 57 y.o. female who is here today for cc of  Chief Complaint  Patient presents with  . Medication Refill    Albuterol, Atrovent and Zantac  . toe numbness    her 3rd and 4th toe on right and left foot have numbness   Gastroesophageal reflux disease, esophagitis presence not specified - Plan: omeprazole (PRILOSEC) 20 MG capsule  Seasonal allergies - Plan: cetirizine (ZYRTEC) 10 MG tablet  Allergic rhinitis, unspecified seasonality, unspecified trigger - Plan: albuterol (PROVENTIL HFA;VENTOLIN HFA) 108 (90 Base) MCG/ACT inhaler, ipratropium (ATROVENT) 0.03 % nasal spray  History of diabetes mellitus - Plan: CANCELED: Hemoglobin A1c  Ear itch - Plan: POCT Skin KOH, triamcinolone (KENALOG) 0.025 % ointment  Dryness of right ear canal - Plan: triamcinolone (KENALOG) 0.025 % ointment  Situational depression - Plan: buPROPion (WELLBUTRIN SR) 150 MG 12 hr tablet  Trena PlattStephanie Lashan Gluth, PA-C [facility] 4/18/20197:29 AM

## 2017-07-15 ENCOUNTER — Telehealth: Payer: Self-pay | Admitting: Physician Assistant

## 2017-07-15 NOTE — Telephone Encounter (Signed)
Copied from CRM [phone]. Topic: Quick Communication - See Telephone Encounter >> Jul 15, 2017  5:32 PM Trula SladeWalter, Linda F wrote: CRM for notification. See Telephone encounter for: 07/15/17. Patient called inquiring about her buPROPion [facility](WELLBUTRIN SR) 150 MG 12 hr tablet prescription that was requested for yesterday 07/14/17, and she would like to have it sent to her preferred pharmacy [facility] [address]

## 2017-07-16 ENCOUNTER — Other Ambulatory Visit: Payer: Self-pay | Admitting: *Deleted

## 2017-07-16 ENCOUNTER — Encounter: Payer: Self-pay | Admitting: Physician Assistant

## 2017-07-16 DIAGNOSIS — F4321 Adjustment disorder with depressed mood: Secondary | ICD-10-CM

## 2017-07-16 MED ORDER — BUPROPION HCL ER (SR) 150 MG PO TB12: 150.0000 mg | ORAL_TABLET | Freq: Two times a day (BID) | ORAL | 1 refills | Status: DC

## 2018-01-14 ENCOUNTER — Other Ambulatory Visit: Payer: Self-pay | Admitting: Family Medicine

## 2018-01-14 DIAGNOSIS — K219 Gastro-esophageal reflux disease without esophagitis: Secondary | ICD-10-CM

## 2018-01-14 MED ORDER — OMEPRAZOLE 20 MG PO CPDR: 20.0000 mg | DELAYED_RELEASE_CAPSULE | Freq: Every day | ORAL | 0 refills | Status: DC

## 2018-01-14 NOTE — Telephone Encounter (Signed)
Requested Prescriptions  Pending Prescriptions Disp Refills  . omeprazole (PRILOSEC) 20 MG capsule 60 capsule 0    Sig: Take 1 capsule (20 mg total) by mouth daily.     Gastroenterology: Proton Pump Inhibitors Passed - 01/14/2018  2:23 PM      Passed - Valid encounter within last 12 months    Recent Outpatient Visits          6 months ago Gastroesophageal reflux disease, esophagitis presence not specified   [facility] at [place] [place], [place]   1 year ago Annual physical exam   [facility] at [place], [place], [place]   1 year ago Dysuria   [facility], [name], FNP   1 year ago Viral URI with cough   [facility], Dorene Grebe, DO   1 year ago Annual physical exam   [facility] at [facility], [name], FNP

## 2018-01-14 NOTE — Telephone Encounter (Signed)
Copied from CRM [id number]. Topic: Quick Communication - Rx Refill/Question >> Jan 14, 2018  1:59 PM Jaquita Rector A wrote: Medication: omeprazole (PRILOSEC) 20 MG capsule   Patient went to the pharmacy for a refill was informed that request was sent to office. She was informed that she need to be seen but said she need a refill due to heartburn. Patient stated that she is not ill so she does not see the need to pay a copay just to get her refill.  Has the patient contacted their pharmacy? Yes.     Preferred Pharmacy (with phone number or street name): [facility] [address], [place] - [address] [phone] (Phone) [phone] (Fax)    Agent: Please be advised that RX refills may take up to 3 business days. We ask that you follow-up with your pharmacy.

## 2018-03-22 ENCOUNTER — Other Ambulatory Visit: Payer: Self-pay | Admitting: Family Medicine

## 2018-03-22 DIAGNOSIS — K219 Gastro-esophageal reflux disease without esophagitis: Secondary | ICD-10-CM

## 2018-04-09 ENCOUNTER — Encounter: Payer: Self-pay | Admitting: Family Medicine

## 2018-04-09 ENCOUNTER — Ambulatory Visit (INDEPENDENT_AMBULATORY_CARE_PROVIDER_SITE_OTHER): Payer: BLUE CROSS/BLUE SHIELD | Admitting: Family Medicine

## 2018-04-09 VITALS — BP 118/71 | HR 78 | Temp 98.6°F | Resp 17 | Ht 67.0 in | Wt 191.0 lb

## 2018-04-09 DIAGNOSIS — J309 Allergic rhinitis, unspecified: Secondary | ICD-10-CM

## 2018-04-09 DIAGNOSIS — Z114 Encounter for screening for human immunodeficiency virus [HIV]: Secondary | ICD-10-CM

## 2018-04-09 DIAGNOSIS — Z13228 Encounter for screening for other metabolic disorders: Secondary | ICD-10-CM

## 2018-04-09 DIAGNOSIS — Z1159 Encounter for screening for other viral diseases: Secondary | ICD-10-CM | POA: Diagnosis not present

## 2018-04-09 DIAGNOSIS — Z1329 Encounter for screening for other suspected endocrine disorder: Secondary | ICD-10-CM

## 2018-04-09 DIAGNOSIS — K219 Gastro-esophageal reflux disease without esophagitis: Secondary | ICD-10-CM

## 2018-04-09 DIAGNOSIS — Z9884 Bariatric surgery status: Secondary | ICD-10-CM

## 2018-04-09 DIAGNOSIS — Z1321 Encounter for screening for nutritional disorder: Secondary | ICD-10-CM

## 2018-04-09 DIAGNOSIS — Z23 Encounter for immunization: Secondary | ICD-10-CM

## 2018-04-09 DIAGNOSIS — Z0001 Encounter for general adult medical examination with abnormal findings: Secondary | ICD-10-CM | POA: Diagnosis not present

## 2018-04-09 DIAGNOSIS — F4321 Adjustment disorder with depressed mood: Secondary | ICD-10-CM

## 2018-04-09 DIAGNOSIS — Z13220 Encounter for screening for lipoid disorders: Secondary | ICD-10-CM

## 2018-04-09 DIAGNOSIS — J302 Other seasonal allergic rhinitis: Secondary | ICD-10-CM

## 2018-04-09 DIAGNOSIS — Z130 Encounter for screening for diseases of the blood and blood-forming organs and certain disorders involving the immune mechanism: Secondary | ICD-10-CM

## 2018-04-09 DIAGNOSIS — Z0000 Encounter for general adult medical examination without abnormal findings: Secondary | ICD-10-CM

## 2018-04-09 MED ORDER — CETIRIZINE HCL 10 MG PO TABS: 10.0000 mg | ORAL_TABLET | Freq: Every day | ORAL | 11 refills | Status: AC

## 2018-04-09 MED ORDER — ALBUTEROL SULFATE HFA 108 (90 BASE) MCG/ACT IN AERS: 2.0000 | INHALATION_SPRAY | RESPIRATORY_TRACT | 11 refills | Status: AC | PRN

## 2018-04-09 MED ORDER — HYDROXYZINE HCL 25 MG PO TABS: 12.5000 mg | ORAL_TABLET | Freq: Three times a day (TID) | ORAL | 0 refills | Status: AC | PRN

## 2018-04-09 MED ORDER — BUPROPION HCL ER (SR) 150 MG PO TB12: 150.0000 mg | ORAL_TABLET | Freq: Two times a day (BID) | ORAL | 1 refills | Status: AC

## 2018-04-09 MED ORDER — IPRATROPIUM BROMIDE 0.03 % NA SOLN: 2.0000 | Freq: Three times a day (TID) | NASAL | 11 refills | Status: AC | PRN

## 2018-04-09 MED ORDER — OMEPRAZOLE 20 MG PO CPDR: 20.0000 mg | DELAYED_RELEASE_CAPSULE | Freq: Every day | ORAL | 0 refills | Status: DC

## 2018-04-09 NOTE — Patient Instructions (Addendum)
   If you have lab work done today you will be contacted with your lab results within the next 2 weeks.  If you have not heard from us then please contact us. The fastest way to get your results is to register for My Chart.   IF you received an x-ray today, you will receive an invoice from [facility]. Please contact [facility] at [phone] with questions or concerns regarding your invoice.   IF you received labwork today, you will receive an invoice from [facility]. Please contact [facility] at [phone] with questions or concerns regarding your invoice.   Our billing staff will not be able to assist you with questions regarding bills from these companies.  You will be contacted with the lab results as soon as they are available. The fastest way to get your results is to activate your My Chart account. Instructions are located on the last page of this paperwork. If you have not heard from us regarding the results in 2 weeks, please contact this office.     Preventive Care 40-64 Years, Female Preventive care refers to lifestyle choices and visits with your health care provider that can promote health and wellness. What does preventive care include?   A yearly physical exam. This is also called an annual well check.  Dental exams once or twice a year.  Routine eye exams. Ask your health care provider how often you should have your eyes checked.  Personal lifestyle choices, including: ? Daily care of your teeth and gums. ? Regular physical activity. ? Eating a healthy diet. ? Avoiding tobacco and drug use. ? Limiting alcohol use. ? Practicing safe sex. ? Taking low-dose aspirin daily starting at age 50. ? Taking vitamin and mineral supplements as recommended by your health care provider. What happens during an annual well check? The services and screenings done by your health care provider during your annual well check will depend on your age, overall health,  lifestyle risk factors, and family history of disease. Counseling Your health care provider may ask you questions about your:  Alcohol use.  Tobacco use.  Drug use.  Emotional well-being.  Home and relationship well-being.  Sexual activity.  Eating habits.  Work and work environment.  Method of birth control.  Menstrual cycle.  Pregnancy history. Screening You may have the following tests or measurements:  Height, weight, and BMI.  Blood pressure.  Lipid and cholesterol levels. These may be checked every 5 years, or more frequently if you are over 50 years old.  Skin check.  Lung cancer screening. You may have this screening every year starting at age 55 if you have a 30-pack-year history of smoking and currently smoke or have quit within the past 15 years.  Colorectal cancer screening. All adults should have this screening starting at age 50 and continuing until age 75. Your health care provider may recommend screening at age 45. You will have tests every 1-10 years, depending on your results and the type of screening test. People at increased risk should start screening at an earlier age. Screening tests may include: ? Guaiac-based fecal occult blood testing. ? Fecal immunochemical test (FIT). ? Stool DNA test. ? Virtual colonoscopy. ? Sigmoidoscopy. During this test, a flexible tube with a tiny camera (sigmoidoscope) is used to examine your rectum and lower colon. The sigmoidoscope is inserted through your anus into your rectum and lower colon. ? Colonoscopy. During this test, a long, thin, flexible tube with a tiny camera (  colonoscope) is used to examine your entire colon and rectum.  Hepatitis C blood test.  Hepatitis B blood test.  Sexually transmitted disease (STD) testing.  Diabetes screening. This is done by checking your blood sugar (glucose) after you have not eaten for a while (fasting). You may have this done every 1-3 years.  Mammogram. This may be  done every 1-2 years. Talk to your health care provider about when you should start having regular mammograms. This may depend on whether you have a family history of breast cancer.  BRCA-related cancer screening. This may be done if you have a family history of breast, ovarian, tubal, or peritoneal cancers.  Pelvic exam and Pap test. This may be done every 3 years starting at age 21. Starting at age 30, this may be done every 5 years if you have a Pap test in combination with an HPV test.  Bone density scan. This is done to screen for osteoporosis. You may have this scan if you are at high risk for osteoporosis. Discuss your test results, treatment options, and if necessary, the need for more tests with your health care provider. Vaccines Your health care provider may recommend certain vaccines, such as:  Influenza vaccine. This is recommended every year.  Tetanus, diphtheria, and acellular pertussis (Tdap, Td) vaccine. You may need a Td booster every 10 years.  Varicella vaccine. You may need this if you have not been vaccinated.  Zoster vaccine. You may need this after age 60.  Measles, mumps, and rubella (MMR) vaccine. You may need at least one dose of MMR if you were born in 1957 or later. You may also need a second dose.  Pneumococcal 13-valent conjugate (PCV13) vaccine. You may need this if you have certain conditions and were not previously vaccinated.  Pneumococcal polysaccharide (PPSV23) vaccine. You may need one or two doses if you smoke cigarettes or if you have certain conditions.  Meningococcal vaccine. You may need this if you have certain conditions.  Hepatitis A vaccine. You may need this if you have certain conditions or if you travel or work in places where you may be exposed to hepatitis A.  Hepatitis B vaccine. You may need this if you have certain conditions or if you travel or work in places where you may be exposed to hepatitis B.  Haemophilus influenzae type b  (Hib) vaccine. You may need this if you have certain conditions. Talk to your health care provider about which screenings and vaccines you need and how often you need them. This information is not intended to replace advice given to you by your health care provider. Make sure you discuss any questions you have with your health care provider. Document Released: 04/13/2015 Document Revised: 05/07/2017 Document Reviewed: 01/16/2015 Elsevier Interactive Patient Education  2019 Elsevier Inc.  

## 2018-04-09 NOTE — Progress Notes (Signed)
1/10/202010:45 AM  Crystal Medina [birth year], 58 y.o. female [id number]  Chief Complaint  Patient presents with  . Annual Exam    with no pap     HPI:   Patient is a 58 y.o. female with past medical history significant for GERD, allergies, depression who presents today for CPE  Last CPE July 2018 Cervical Cancer Screening: 2/19, thru gyn, Dr Darlina Rumpf, [place] Breast Cancer Screening: 2/19, mammo thru breast surgeon, Alric Seton, [place]  Colorectal Cancer Screening: Dr Barnie Mort, at age 38, [place] Bone Density Testing: at age 76 HIV Screening: ordered today Seasonal Influenza Vaccination: will do today Td/Tdap Vaccination: she believes to be UTD Pneumococcal Vaccination: at age 32 Zoster Vaccination: at pharmacy of choice Frequency of Dental evaluation: Q6 months Frequency of Eye evaluation: wears eyeglasses, yearly  Requesting refill of hydroxyzine 39m, prn for anxiety She has h/o gastric bypass sleeve, needs vitamin levels checked [facility]  Fall Risk  04/09/2018 09/29/2016 03/12/2016 02/15/2016 02/08/2016  Falls in the past year? 0 No No No No     [facility] 04/09[facility]2018 09/29/2016 03/12/2016  Decreased Interest 0 0 3  Down, Depressed, Hopeless 0 0 3  PHQ - 2 Score 0 0 6  Altered sleeping 0 - 3  Tired, decreased energy 0 - 3  Change in appetite 0 - 3  Feeling bad or failure about yourself  0 - 0  Trouble concentrating 0 - 3  Moving slowly or fidgety/restless 0 - 0  Suicidal thoughts 0 - 1  PHQ-9 Score 0 - 19  Difficult doing work/chores Not difficult at all - Somewhat difficult    Allergies  Allergen Reactions  . Sulfa Antibiotics Anaphylaxis    Prior to Admission medications   Medication Sig Start Date End Date Taking? Authorizing Provider  albuterol (PROVENTIL HFA;VENTOLIN HFA) 108 (90 Base) MCG/ACT inhaler Inhale 2 puffs into the lungs every 4 (four) hours as needed for wheezing or shortness of breath (cough, shortness of  breath or wheezing.). 07/14/17  Yes English, SColletta MarylandD, PA  buPROPion (WELLBUTRIN SR) 150 MG 12 hr tablet Take 1 tablet (150 mg total) by mouth 2 (two) times daily. 07/16/17  Yes English, SColletta MarylandD, PA  cetirizine (ZYRTEC) 10 MG tablet Take 1 tablet (10 mg total) by mouth daily. 07/14/17  Yes English, SColletta MarylandD, PA  ipratropium (ATROVENT) 0.03 % nasal spray Place 2 sprays into both nostrils 3 (three) times daily as needed for rhinitis (nasal congestion). 07/14/17  Yes English, SColletta MarylandD, PA  loratadine-pseudoephedrine (CLARITIN-D 24-HOUR) 10-240 MG per 24 hr tablet Take 1 tablet by mouth daily. Reported on 07/25/2015   Yes [provider]  omeprazole (PRILOSEC) 20 MG capsule TAKE ONE CAPSULE BY MOUTH DAILY 03/22/18  Yes Stallings, Zoe A, MD  ranitidine (ZANTAC) 150 MG tablet Take 1 tablet (150 mg total) by mouth 2 (two) times daily. 12/05/16  Yes English, SColletta MarylandD, PA  triamcinolone (KENALOG) 0.025 % ointment Apply 1 application topically 2 (two) times daily. 07/14/17  Yes EJoretta Bachelor PA    Past Medical History:  Diagnosis Date  . Allergy   . Asthma   . Depression   . [place]   . GERD (gastroesophageal reflux disease)     Past Surgical History:  Procedure Laterality Date  . BREAST SURGERY    . LAPAROSCOPIC GASTRIC SLEEVE RESECTION      Social History   Tobacco Use  . Smoking status:  Former Smoker    Packs/day: 0.50    Years: 30.00    Pack years: 15.00  . Smokeless tobacco: Never Used  Substance Use Topics  . Alcohol use: Yes    Alcohol/week: 2.0 standard drinks    Types: 2 Standard drinks or equivalent per week    Family History  Problem Relation Age of Onset  . Diabetes Mother   . Hyperlipidemia Mother   . Cancer Mother   . Hypertension Mother   . Heart disease Father   . Hyperlipidemia Father   . Hypertension Father   . Cancer Sister   . Hyperlipidemia Sister   . Hypertension Sister   . Mental illness  Maternal Grandmother     Review of Systems  Constitutional: Negative for chills and fever.  Respiratory: Negative for cough and shortness of breath.   Cardiovascular: Negative for chest pain, palpitations and leg swelling.  Gastrointestinal: Negative for abdominal pain, nausea and vomiting.  Musculoskeletal: Positive for neck pain.  Endo/Heme/Allergies: Positive for environmental allergies.  All other systems reviewed and are negative.    OBJECTIVE:  Blood pressure 118/71, pulse 78, temperature 98.6 F (37 C), temperature source Oral, resp. rate 17, height '5\' 7"'  (1.702 m), weight 191 lb (86.6 kg), SpO2 98 %. Body mass index is 29.91 kg/m.   Physical Exam Vitals signs and nursing note reviewed.  Constitutional:      Appearance: She is well-developed.  HENT:     Head: Normocephalic and atraumatic.     Right Ear: Hearing, tympanic membrane, ear canal and external ear normal.     Left Ear: Hearing, tympanic membrane, ear canal and external ear normal.  Eyes:     Conjunctiva/sclera: Conjunctivae normal.     Pupils: Pupils are equal, round, and reactive to light.  Neck:     Musculoskeletal: Neck supple.     Thyroid: No thyromegaly.  Cardiovascular:     Rate and Rhythm: Normal rate and regular rhythm.     Heart sounds: Normal heart sounds. No murmur. No friction rub. No gallop.   Pulmonary:     Effort: Pulmonary effort is normal.     Breath sounds: Normal breath sounds. No wheezing or rales.  Abdominal:     General: Bowel sounds are normal. There is no distension.     Palpations: Abdomen is soft. There is no mass.     Tenderness: There is no abdominal tenderness.  Musculoskeletal: Normal range of motion.  Lymphadenopathy:     Cervical: No cervical adenopathy.  Skin:    General: Skin is warm and dry.  Neurological:     Mental Status: She is alert and oriented to person, place, and time.     Cranial Nerves: No cranial nerve deficit.     Gait: Gait normal.     Deep Tendon  Reflexes: Reflexes are normal and symmetric.     ASSESSMENT and PLAN  1. Annual physical exam No concerns per history or exam. Routine HCM labs ordered. HCM reviewed/discussed. Anticipatory guidance regarding healthy weight, lifestyle and choices given.    2. Screening for HIV (human immunodeficiency virus) - HIV Antibody (routine testing w rflx)  3. Encounter for hepatitis C screening test for low risk patient - HCV Ab w Reflex to Quant PCR  4. Need for prophylactic vaccination and inoculation against influenza - Flu Vaccine QUAD 36+ mos IM  5. Screening for endocrine, nutritional, metabolic and immunity disorder - CMP14+EGFR  6. Screening for thyroid disorder - TSH  7. Screening for  lipid disorders - Lipid panel  8. Situational depression - buPROPion (WELLBUTRIN SR) 150 MG 12 hr tablet; Take 1 tablet (150 mg total) by mouth 2 (two) times daily.  9. Allergic rhinitis, unspecified seasonality, unspecified trigger - albuterol (PROVENTIL HFA;VENTOLIN HFA) 108 (90 Base) MCG/ACT inhaler; Inhale 2 puffs into the lungs every 4 (four) hours as needed for wheezing or shortness of breath (cough, shortness of breath or wheezing.). - ipratropium (ATROVENT) 0.03 % nasal spray; Place 2 sprays into both nostrils 3 (three) times daily as needed for rhinitis (nasal congestion).  10. Seasonal allergies - cetirizine (ZYRTEC) 10 MG tablet; Take 1 tablet (10 mg total) by mouth daily.  11. Gastroesophageal reflux disease, esophagitis presence not specified - omeprazole (PRILOSEC) 20 MG capsule; Take 1 capsule (20 mg total) by mouth daily.  12. H/O gastric bypass - VITAMIN D 25 Hydroxy (Vit-D Deficiency, Fractures) - Ferritin - Vitamin B12  Other orders - hydrOXYzine (ATARAX/VISTARIL) 25 MG tablet; Take 0.5-1 tablets (12.5-25 mg total) by mouth 3 (three) times daily as needed.   No follow-ups on file.    Rutherford Guys, MD [facility] at [place], [place]  [address] Ph.  [phone] Fax [phone]

## 2018-04-10 LAB — FERRITIN: Ferritin: 64 ng/mL (ref 15–150)

## 2018-04-10 LAB — VITAMIN B12: Vitamin B-12: 1201 pg/mL (ref 232–1245)

## 2018-04-12 LAB — HCV INTERPRETATION

## 2018-04-12 LAB — CMP14+EGFR
ALT: 19 IU/L (ref 0–32)
AST: 22 IU/L (ref 0–40)
Albumin/Globulin Ratio: 1.7 (ref 1.2–2.2)
Albumin: 4.3 g/dL (ref 3.5–5.5)
Alkaline Phosphatase: 63 IU/L (ref 39–117)
BUN/Creatinine Ratio: 15 (ref 9–23)
BUN: 12 mg/dL (ref 6–24)
Bilirubin Total: 0.5 mg/dL (ref 0.0–1.2)
CO2: 24 mmol/L (ref 20–29)
Calcium: 9.3 mg/dL (ref 8.7–10.2)
Chloride: 99 mmol/L (ref 96–106)
Creatinine, Ser: 0.82 mg/dL (ref 0.57–1.00)
GFR calc Af Amer: 92 mL/min/{1.73_m2} (ref >59)
GFR calc non Af Amer: 80 mL/min/{1.73_m2} (ref >59)
Globulin, Total: 2.5 g/dL (ref 1.5–4.5)
Glucose: 82 mg/dL (ref 65–99)
Potassium: 4.2 mmol/L (ref 3.5–5.2)
Sodium: 139 mmol/L (ref 134–144)
Total Protein: 6.8 g/dL (ref 6.0–8.5)

## 2018-04-12 LAB — HIV ANTIBODY (ROUTINE TESTING W REFLEX): HIV Screen 4th Generation wRfx: NONREACTIVE

## 2018-04-12 LAB — LIPID PANEL
Chol/HDL Ratio: 2.8 ratio (ref 0.0–4.4)
Cholesterol, Total: 226 mg/dL — ABNORMAL HIGH (ref 100–199)
HDL: 82 mg/dL (ref >39)
LDL Calculated: 124 mg/dL — ABNORMAL HIGH (ref 0–99)
Triglycerides: 99 mg/dL (ref 0–149)
VLDL Cholesterol Cal: 20 mg/dL (ref 5–40)

## 2018-04-12 LAB — VITAMIN D 25 HYDROXY (VIT D DEFICIENCY, FRACTURES): Vit D, 25-Hydroxy: 30.8 ng/mL (ref 30.0–100.0)

## 2018-04-12 LAB — TSH: TSH: 1.13 u[IU]/mL (ref 0.450–4.500)

## 2018-04-12 LAB — HCV AB W REFLEX TO QUANT PCR: HCV Ab: <0.1 s/co ratio (ref 0.0–0.9)

## 2018-04-21 ENCOUNTER — Encounter: Payer: Self-pay | Admitting: Family Medicine

## 2018-05-25 ENCOUNTER — Other Ambulatory Visit: Payer: Self-pay | Admitting: Family Medicine

## 2018-05-25 DIAGNOSIS — K219 Gastro-esophageal reflux disease without esophagitis: Secondary | ICD-10-CM

## 2018-06-03 IMAGING — DX DG FINGER MIDDLE 2+V*L*
3 series · 3 of 3 positions shown · non-contrast
Comparison: None.

CLINICAL DATA: Fell on top of finger with tenderness

EXAM:
LEFT MIDDLE FINGER 2+V

[finger ap]
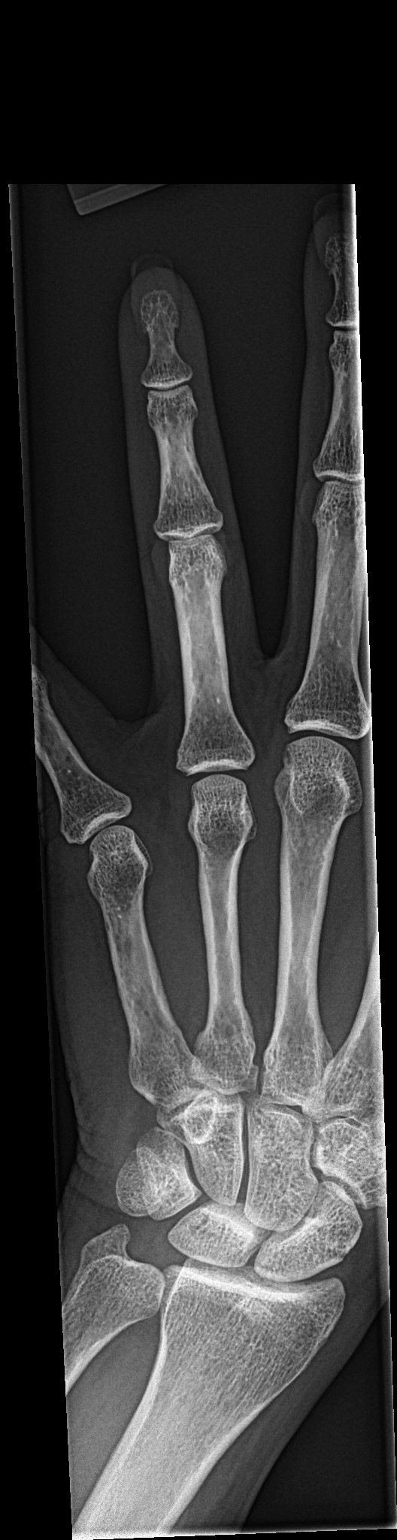

[finger obl]
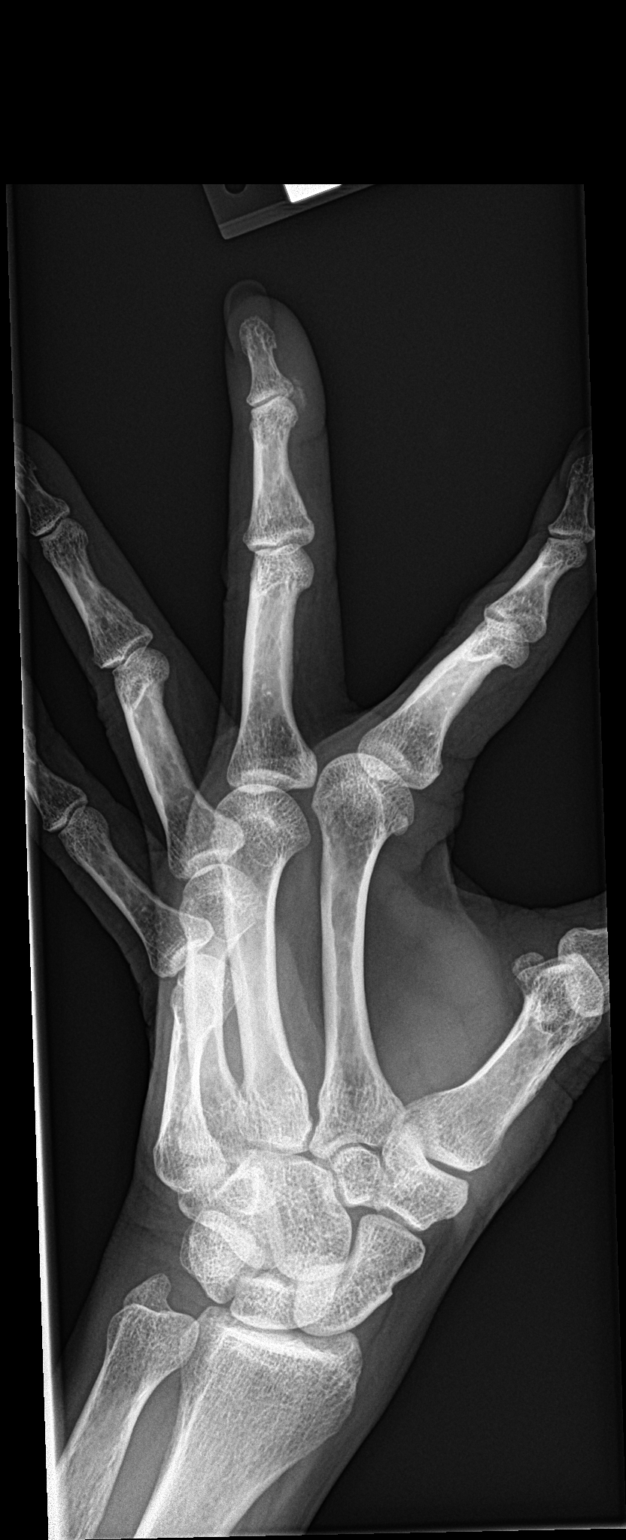

[finger lat]
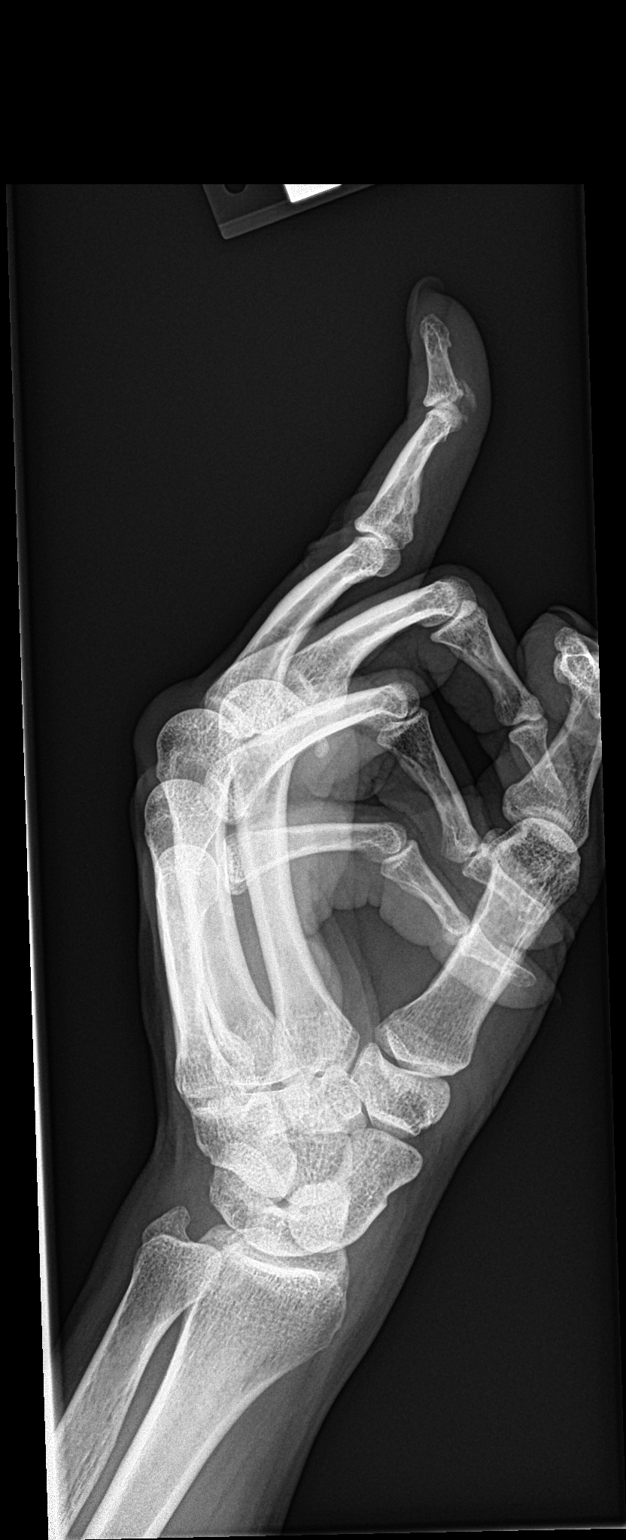

[3 of 3 positions shown; findings below may reference images not displayed]

FINDINGS: No acute fracture dislocation. Along the volar aspect of the distal
interphalangeal joint of the third digit there is ossification which
could indicate remote injury or calcific tendinitis.
IMPRESSION: No evidence acute fracture. Calcification inferior to the distal
interphalangeal joint may represent arthropathy or remote trauma.
